# Patient Record
Sex: Female | Born: 2000
Health system: Southern US, Community
[De-identification: ages and names within clinical notes are randomized; demographics above are authoritative.]

## PROBLEM LIST (undated history)

## (undated) ENCOUNTER — Inpatient Hospital Stay (HOSPITAL_COMMUNITY): Payer: Self-pay

## (undated) DIAGNOSIS — M6740 Ganglion, unspecified site: Secondary | ICD-10-CM

## (undated) DIAGNOSIS — R011 Cardiac murmur, unspecified: Secondary | ICD-10-CM

## (undated) DIAGNOSIS — Z973 Presence of spectacles and contact lenses: Secondary | ICD-10-CM

## (undated) DIAGNOSIS — R569 Unspecified convulsions: Secondary | ICD-10-CM

## (undated) DIAGNOSIS — D649 Anemia, unspecified: Secondary | ICD-10-CM

## (undated) HISTORY — PX: NO PAST SURGERIES: SHX2092

## (undated) HISTORY — PX: WISDOM TOOTH EXTRACTION: SHX21

---
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
## 2022-02-17 ENCOUNTER — Other Ambulatory Visit (HOSPITAL_COMMUNITY): Payer: Self-pay

## 2022-02-17 ENCOUNTER — Ambulatory Visit (HOSPITAL_COMMUNITY)
Admission: EM | Admit: 2022-02-17 | Discharge: 2022-02-17 | Disposition: A | Discharge status: Home / Self Care | Payer: 59 | Attending: Emergency Medicine | Admitting: Emergency Medicine

## 2022-02-17 ENCOUNTER — Encounter (HOSPITAL_COMMUNITY): Payer: Self-pay | Admitting: Emergency Medicine

## 2022-02-17 ENCOUNTER — Other Ambulatory Visit: Payer: Self-pay

## 2022-02-17 DIAGNOSIS — J069 Acute upper respiratory infection, unspecified: Secondary | ICD-10-CM | POA: Diagnosis not present

## 2022-02-17 LAB — POC INFLUENZA A AND B ANTIGEN (URGENT CARE ONLY)
INFLUENZA A ANTIGEN, POC: NEGATIVE
INFLUENZA B ANTIGEN, POC: NEGATIVE

## 2022-02-17 MED ORDER — IBUPROFEN 800 MG PO TABS
800.0000 mg | ORAL_TABLET | Freq: Three times a day (TID) | ORAL | 0 refills | Status: DC
  Filled 2022-02-17: qty 21, 7d supply, fill #0

## 2022-02-17 MED ORDER — LIDOCAINE VISCOUS HCL 2 % MT SOLN
15.0000 mL | OROMUCOSAL | 0 refills | Status: DC | PRN
  Filled 2022-02-17: qty 100, 2d supply, fill #0

## 2022-02-17 NOTE — ED Provider Notes (Signed)
[place]    CSN: [id number] Arrival date & time: 02/17/22  1200      History   Chief Complaint Chief Complaint  Patient presents with   Cough    HPI Molly Bryant is a 22 y.o. female.   Patient presents for evaluation of fever, chills, body aches, nasal congestion, rhinorrhea, sore throat, bilateral ear pain, cough, nausea and diarrhea beginning 1 day ago.  Fever peaking at 101.3.  Last occurrence of diarrhea early this morning, stool is described as soft.  Cough is nonproductive, denies shortness of breath and wheezing.  No known sick contact prior.  Decreased appetite and poor fluid intake.  Has attempted use of Mucinex.  Past Medical History:  Diagnosis Date   Seizures [facility])     Patient Active Problem List   Diagnosis Date Noted   Encounter for routine child health examination without abnormal findings 09/20/2020    Past Surgical History:  Procedure Laterality Date   NO PAST SURGERIES      OB History   No obstetric history on file.      Home Medications    Prior to Admission medications   Medication Sig Start Date End Date Taking? Authorizing Provider  cyclobenzaprine (FLEXERIL) 5 MG tablet Take 1 tablet (5 mg total) by mouth 3 (three) times daily as needed for muscle spasms. Patient not taking: Reported on 02/17/2022 11/26/21   Drenda Freeze, MD  cyclobenzaprine (FLEXERIL) 5 MG tablet Take 1 tablet (5 mg total) by mouth 3 (three) times daily as needed for muscle spasms. Patient not taking: Reported on 02/17/2022 11/26/21   Drenda Freeze, MD  amitriptyline (ELAVIL) 50 MG tablet 25 mg at night If no improvement in symptoms after 3 weeks  than increase dose to 50 mg at night 10/08/19 11/13/19  Mir, Gwendolyn Lima, MD  diazepam (DIASAT) 20 MG GEL LOCK AT 12 5MG   TO BE GIVEN RECTALLY FOR SEIZURE LASTING GREATER THAN 5 MINUTES 11/22/16 11/13/19  [provider]  hyoscyamine (LEVSIN) 0.125 MG tablet Take 1 tablet (0.125 mg total) by mouth  every 4 (four) hours as needed. 09/08/19 11/13/19  Cletis Media, NP  midazolam (VERSED) 10 MG/2ML SOLN injection SPRAY 1 ML IN EACH NOSTRIL AS NEEDED FOR SEIZURES LASTING GREATER THAN 5 MINUTES 11/15/16 11/13/19  [provider]    Family History Family History  Problem Relation Age of Onset   Hypertension Mother    Migraines Mother    Bipolar disorder Paternal Aunt    Seizures Neg Hx    Depression Neg Hx    Anxiety disorder Neg Hx    Schizophrenia Neg Hx    ADD / ADHD Neg Hx    Autism Neg Hx     Social History Social History   Tobacco Use   Smoking status: Never   Smokeless tobacco: Never  Vaping Use   Vaping Use: Never used  Substance Use Topics   Alcohol use: No   Drug use: Not Currently    Types: Marijuana     Allergies   Penicillins   Review of Systems Review of Systems  Constitutional:  Positive for chills and fever. Negative for activity change, appetite change, diaphoresis, fatigue and unexpected weight change.  HENT:  Positive for congestion, ear pain, rhinorrhea and sore throat. Negative for dental problem, drooling, ear discharge, facial swelling, hearing loss, mouth sores, nosebleeds, postnasal drip, sinus pressure, sinus pain, sneezing, tinnitus, trouble swallowing and voice change.   Respiratory:  Positive for cough.  Negative for apnea, choking, chest tightness, shortness of breath, wheezing and stridor.   Cardiovascular: Negative.  Negative for chest pain.  Gastrointestinal:  Positive for diarrhea and nausea. Negative for abdominal distention, abdominal pain, anal bleeding, blood in stool, constipation, rectal pain and vomiting.  Musculoskeletal:  Positive for myalgias. Negative for arthralgias, back pain, gait problem, joint swelling, neck pain and neck stiffness.  Skin: Negative.      Physical Exam Triage Vital Signs ED Triage Vitals  Enc Vitals Group     BP 02/17/22 1313 120/78     Pulse Rate 02/17/22 1313 95     Resp 02/17/22 1313  16     Temp 02/17/22 1313 98.3 F (36.8 C)     Temp Source 02/17/22 1313 Oral     SpO2 02/17/22 1313 98 %     Weight --      Height --      Head Circumference --      Peak Flow --      Pain Score 02/17/22 1310 7     Pain Loc --      Pain Edu? --      Excl. in GC? --    No data found.  Updated Vital Signs BP 120/78   Pulse 95   Temp 98.3 F (36.8 C) (Oral)   Resp 16   LMP 02/04/2022   SpO2 98%   Visual Acuity Right Eye Distance:   Left Eye Distance:   Bilateral Distance:    Right Eye Near:   Left Eye Near:    Bilateral Near:     Physical Exam Constitutional:      Appearance: Normal appearance.  HENT:     Right Ear: Ear canal and external ear normal. A middle ear effusion is present.     Left Ear: Ear canal and external ear normal. A middle ear effusion is present.     Nose: Congestion and rhinorrhea present.     Mouth/Throat:     Mouth: Mucous membranes are moist.     Pharynx: Oropharynx is clear. Posterior oropharyngeal erythema present.  Eyes:     Extraocular Movements: Extraocular movements intact.  Cardiovascular:     Rate and Rhythm: Normal rate and regular rhythm.     Pulses: Normal pulses.     Heart sounds: Normal heart sounds.  Pulmonary:     Effort: Pulmonary effort is normal.     Breath sounds: Normal breath sounds.  Neurological:     Mental Status: She is alert and oriented to person, place, and time. Mental status is at baseline.      UC Treatments / Results  Labs (all labs ordered are listed, but only abnormal results are displayed) Labs Reviewed  POC INFLUENZA A AND B ANTIGEN (URGENT CARE ONLY)    EKG   Radiology No results found.  Procedures Procedures (including critical care time)  Medications Ordered in UC Medications - No data to display  Initial Impression / Assessment and Plan / UC Course  I have reviewed the triage vital signs and the nursing notes.  Pertinent labs & imaging results that were available during my  care of the patient were reviewed by me and considered in my medical decision making (see chart for details).  Viral URI with cough  Patient is in no signs of distress nor toxic appearing.  Vital signs are stable.  Low suspicion for pneumonia, pneumothorax or bronchitis and therefore will defer imaging.  Flu test negative. Prescribed viscous lidocaine and  ibuprofen 800 mg a sore throat is most worrisome symptom. May use additional over-the-counter medications as needed for supportive care.  May follow-up with urgent care as needed if symptoms persist or worsen.  Note given.   Final Clinical Impressions(s) / UC Diagnoses   Final diagnoses:  None   Discharge Instructions   None    ED Prescriptions   None    PDMP not reviewed this encounter.   Hans Eden, NP 02/17/22 1406

## 2022-02-17 NOTE — Discharge Instructions (Signed)
Your symptoms today are most likely being caused by a virus and should steadily improve in time it can take up to 7 to 10 days before you truly start to see a turnaround however things will get better  Flu test negative  You may gargle and spit lidocaine solution every 4 hours to provide a temporary numbing effect to your throat  You may use ibuprofen's every 8 hours in addition to Tylenol 500 mg every 6 hours for management of pain    You can take Tylenol and/or Ibuprofen as needed for fever reduction and pain relief.   For cough: honey 1/2 to 1 teaspoon (you can dilute the honey in water or another fluid).  You can also use guaifenesin and dextromethorphan for cough. You can use a humidifier for chest congestion and cough.  If you don't have a humidifier, you can sit in the bathroom with the hot shower running.      For sore throat: try warm salt water gargles, cepacol lozenges, throat spray, warm tea or water with lemon/honey, popsicles or ice, or OTC cold relief medicine for throat discomfort.   For congestion: take a daily anti-histamine like Zyrtec, Claritin, and a oral decongestant, such as pseudoephedrine.  You can also use Flonase 1-2 sprays in each nostril daily.   It is important to stay hydrated: drink plenty of fluids (water, gatorade/powerade/pedialyte, juices, or teas) to keep your throat moisturized and help further relieve irritation/discomfort.

## 2022-02-17 NOTE — ED Triage Notes (Signed)
Complains of cough congestion and body aches, sore throat and fever (101+ last night )  Has not taken any medicines today.   Symptoms started yesterday morning

## 2022-02-24 ENCOUNTER — Ambulatory Visit (HOSPITAL_COMMUNITY)
Admission: EM | Admit: 2022-02-24 | Discharge: 2022-02-24 | Disposition: A | Discharge status: Home / Self Care | Payer: 59 | Attending: Emergency Medicine | Admitting: Emergency Medicine

## 2022-02-24 ENCOUNTER — Other Ambulatory Visit (HOSPITAL_COMMUNITY): Payer: Self-pay

## 2022-02-24 ENCOUNTER — Telehealth: Payer: Self-pay | Admitting: Pediatrics

## 2022-02-24 ENCOUNTER — Encounter (HOSPITAL_COMMUNITY): Payer: Self-pay

## 2022-02-24 DIAGNOSIS — L42 Pityriasis rosea: Secondary | ICD-10-CM

## 2022-02-24 DIAGNOSIS — J069 Acute upper respiratory infection, unspecified: Secondary | ICD-10-CM | POA: Diagnosis not present

## 2022-02-24 MED ORDER — DM-GUAIFENESIN ER 30-600 MG PO TB12
1.0000 | ORAL_TABLET | Freq: Two times a day (BID) | ORAL | 0 refills | Status: AC
  Filled 2022-02-24: qty 10, 5d supply, fill #0

## 2022-02-24 NOTE — Discharge Instructions (Addendum)
Cough and congestion: You likely have a virus. This is treated with symptomatic care. I recommend mucinex DM twice daily - this medicine works best if used with lots of fluids. You can also try once daily allegra.  Rash: Pityriasis rosea is a rash that often begins as an oval spot on the face, chest, abdomen or back. This is called a herald patch and may be up to 4 inches across. Then you may get smaller spots that sweep out from the middle of the body. The rash can be itchy. Pityriasis rosea can happen at any age but is most common between the ages of 15 and 87. It tends to go away on its own within 10 weeks. The rash persists for several weeks and heals without scarring. Medicated lotions may lessen itchiness and speed the disappearance of the rash. Often, though, no treatment is required. The condition is not contagious and seldom recurs  If you are concerned you can follow with your primary care provider, or see a dermatologist.

## 2022-02-24 NOTE — ED Triage Notes (Signed)
Chief Complaint: Patient was seen a week ago for URI symptoms. States cough and congestion worse. States now has a rash. States the rash is spreading from the shoulders and throughout to torso.   Onset: rash started last Wednesday   Prescriptions or OTC medications tried: Yes- lidocaine oral, ibuprofen.     with mild relief  Sick exposure: No  New foods, medications, or products: No  Recent Travel: No

## 2022-02-24 NOTE — ED Provider Notes (Signed)
[place]    CSN: [id number] Arrival date & time: 02/24/22  1006      History   Chief Complaint Chief Complaint  Patient presents with   Rash   Cough    HPI Molly Bryant is a 22 y.o. female.  Here with rash x 1 week Not itchy, not painful Started behind her ear. Now some spots on the face, right upper arm, low back, one on the belly She tried hydrocortisone cream Denies known exposures to allergens. No new medications or products. Uses sensitive skin soaps  Seen last week for URI Reports continued cough and congestion Has not used any medications apart from ibuprofen No fevers  Past Medical History:  Diagnosis Date   Seizures [facility])     Patient Active Problem List   Diagnosis Date Noted   Encounter for routine child health examination without abnormal findings 09/20/2020    Past Surgical History:  Procedure Laterality Date   NO PAST SURGERIES      OB History   No obstetric history on file.      Home Medications    Prior to Admission medications   Medication Sig Start Date End Date Taking? Authorizing Provider  dextromethorphan-guaiFENesin (MUCINEX DM) 30-600 MG 12hr tablet Take 1 tablet by mouth 2 (two) times daily for 5 days. 02/24/22 03/01/22 Yes Tashawnda Bleiler, Wells Guiles, PA-C  amitriptyline (ELAVIL) 50 MG tablet 25 mg at night If no improvement in symptoms after 3 weeks  than increase dose to 50 mg at night 10/08/19 11/13/19  Mir, Gwendolyn Lima, MD  diazepam (DIASAT) 20 MG GEL LOCK AT 12 5MG   TO BE GIVEN RECTALLY FOR SEIZURE LASTING GREATER THAN 5 MINUTES 11/22/16 11/13/19  [provider]  hyoscyamine (LEVSIN) 0.125 MG tablet Take 1 tablet (0.125 mg total) by mouth every 4 (four) hours as needed. 09/08/19 11/13/19  Cletis Media, NP  midazolam (VERSED) 10 MG/2ML SOLN injection SPRAY 1 ML IN EACH NOSTRIL AS NEEDED FOR SEIZURES LASTING GREATER THAN 5 MINUTES 11/15/16 11/13/19  [provider]    Family History Family History  Problem  Relation Age of Onset   Hypertension Mother    Migraines Mother    Bipolar disorder Paternal Aunt    Seizures Neg Hx    Depression Neg Hx    Anxiety disorder Neg Hx    Schizophrenia Neg Hx    ADD / ADHD Neg Hx    Autism Neg Hx     Social History Social History   Tobacco Use   Smoking status: Never   Smokeless tobacco: Never  Vaping Use   Vaping Use: Never used  Substance Use Topics   Alcohol use: No   Drug use: Not Currently    Types: Marijuana     Allergies   Penicillins   Review of Systems Review of Systems As per HPI  Physical Exam Triage Vital Signs ED Triage Vitals  Enc Vitals Group     BP 02/24/22 1123 120/74     Pulse Rate 02/24/22 1123 95     Resp 02/24/22 1123 16     Temp 02/24/22 1123 98.1 F (36.7 C)     Temp Source 02/24/22 1123 Oral     SpO2 02/24/22 1123 98 %     Weight 02/24/22 1122 128 lb 12 oz (58.4 kg)     Height 02/24/22 1122 5' (1.524 m)     Head Circumference --      Peak Flow --      Pain  Score 02/24/22 1121 5     Pain Loc --      Pain Edu? --      Excl. in GC? --    No data found.  Updated Vital Signs BP 120/74 (BP Location: Left Arm)   Pulse 95   Temp 98.1 F (36.7 C) (Oral)   Resp 16   Ht 5' (1.524 m)   Wt 128 lb 12 oz (58.4 kg)   LMP 02/04/2022   SpO2 98%   BMI 25.14 kg/m     Physical Exam Vitals and nursing note reviewed.  Constitutional:      General: She is not in acute distress.    Appearance: Normal appearance.  HENT:     Right Ear: Tympanic membrane and ear canal normal.     Left Ear: Tympanic membrane and ear canal normal.     Nose: Congestion present. No rhinorrhea.     Mouth/Throat:     Mouth: Mucous membranes are moist.     Pharynx: Oropharynx is clear. No posterior oropharyngeal erythema.  Eyes:     Conjunctiva/sclera: Conjunctivae normal.  Cardiovascular:     Rate and Rhythm: Normal rate and regular rhythm.     Pulses: Normal pulses.     Heart sounds: Normal heart sounds.  Pulmonary:      Effort: Pulmonary effort is normal.     Breath sounds: Normal breath sounds.  Abdominal:     Tenderness: There is no abdominal tenderness.  Musculoskeletal:     Cervical back: Normal range of motion.  Lymphadenopathy:     Cervical: No cervical adenopathy.  Skin:    Findings: Rash present.     Comments: A couple red macular, round areas with some central dry crusting to the face, right shoulder, low back, low belly. No central clearing or raised border.  Neurological:     Mental Status: She is alert.     UC Treatments / Results  Labs (all labs ordered are listed, but only abnormal results are displayed) Labs Reviewed - No data to display  EKG   Radiology No results found.  Procedures Procedures   Medications Ordered in UC Medications - No data to display  Initial Impression / Assessment and Plan / UC Course  I have reviewed the triage vital signs and the nursing notes.  Pertinent labs & imaging results that were available during my care of the patient were reviewed by me and considered in my medical decision making (see chart for details).  Viral URI Discussed symptomatic care for congestion and cough. Recommend mucinex DM with lots of fluids. Can add daily allergy med as well.  Rash Seems to be pityriasis Discussed etiology. Discussed no treatment given she has no pain or itching. Provided further information in AVS Can follow with derm if symptoms persist or worsen. Patient understands rash can remain for weeks to months.  Final Clinical Impressions(s) / UC Diagnoses   Final diagnoses:  Pityriasis rosea  Viral URI with cough     Discharge Instructions      Cough and congestion: You likely have a virus. This is treated with symptomatic care. I recommend mucinex DM twice daily - this medicine works best if used with lots of fluids. You can also try once daily allegra.  Rash: Pityriasis rosea is a rash that often begins as an oval spot on the face, chest,  abdomen or back. This is called a herald patch and may be up to 4 inches across. Then you may get  smaller spots that sweep out from the middle of the body. The rash can be itchy. Pityriasis rosea can happen at any age but is most common between the ages of 90 and 70. It tends to go away on its own within 10 weeks. The rash persists for several weeks and heals without scarring. Medicated lotions may lessen itchiness and speed the disappearance of the rash. Often, though, no treatment is required. The condition is not contagious and seldom recurs  If you are concerned you can follow with your primary care provider, or see a dermatologist.     ED Prescriptions     Medication Sig Dispense Auth. Provider   dextromethorphan-guaiFENesin (MUCINEX DM) 30-600 MG 12hr tablet Take 1 tablet by mouth 2 (two) times daily for 5 days. 10 tablet Romi Rathel, Wells Guiles, PA-C      PDMP not reviewed this encounter.   [place], [place] 02/24/22 1335

## 2022-02-24 NOTE — Telephone Encounter (Signed)
Patient has been scheduled

## 2022-02-24 NOTE — Telephone Encounter (Signed)
Molly Bryant called in and was wanting to know if Molly Bryant would take her on a new patient. Her two older sisters [name] and [name] are current patients of hers. Please advise. Thank you!

## 2022-02-24 NOTE — Telephone Encounter (Signed)
Yes, of course

## 2022-02-26 ENCOUNTER — Ambulatory Visit (INDEPENDENT_AMBULATORY_CARE_PROVIDER_SITE_OTHER): Payer: 59 | Admitting: Primary Care

## 2022-02-26 ENCOUNTER — Other Ambulatory Visit (HOSPITAL_COMMUNITY): Payer: Self-pay

## 2022-02-26 ENCOUNTER — Encounter: Payer: Self-pay | Admitting: Primary Care

## 2022-02-26 VITALS — BP 124/78 | HR 88 | Temp 97.9°F | Ht 60.0 in | Wt 134.0 lb

## 2022-02-26 DIAGNOSIS — L42 Pityriasis rosea: Secondary | ICD-10-CM | POA: Insufficient documentation

## 2022-02-26 DIAGNOSIS — Z87898 Personal history of other specified conditions: Secondary | ICD-10-CM | POA: Insufficient documentation

## 2022-02-26 DIAGNOSIS — Z30011 Encounter for initial prescription of contraceptive pills: Secondary | ICD-10-CM | POA: Insufficient documentation

## 2022-02-26 DIAGNOSIS — L700 Acne vulgaris: Secondary | ICD-10-CM | POA: Insufficient documentation

## 2022-02-26 LAB — POCT URINE PREGNANCY: Preg Test, Ur: NEGATIVE

## 2022-02-26 MED ORDER — ACYCLOVIR 400 MG PO TABS
400.0000 mg | ORAL_TABLET | Freq: Every day | ORAL | 0 refills | Status: AC
  Filled 2022-02-26: qty 35, 7d supply, fill #0

## 2022-02-26 MED ORDER — NORETHIN ACE-ETH ESTRAD-FE 1-20 MG-MCG PO TABS
1.0000 | ORAL_TABLET | Freq: Every day | ORAL | 3 refills | Status: DC
  Filled 2022-02-26: qty 84, 84d supply, fill #0
  Filled 2022-05-31: qty 84, 84d supply, fill #1

## 2022-02-26 NOTE — Assessment & Plan Note (Signed)
Classic case on exam today.  Likely provoked by prior viral URI symptoms.   Discussed that treatment options are limited but that we could try acyclovir, especially given the rapid widespread case. She agrees.  Start acyclovir 400 mg five times daily x 7 days. She will update.

## 2022-02-26 NOTE — Patient Instructions (Signed)
Start acyclovir 400 mg tablets for the rash. Take 1 tablet by mouth five times daily for 1 week.  Take your first birth control pill on the Sunday after your period starts. For example, if you start your period on Monday, then take the birth control pill that following Sunday. If you start your period on Sunday, then take your first pill that same day.  You must take your pill at the same time each day.  If you miss a pill then take the pill as soon as you remember, and also take your pill at the regularly scheduled time, even if this means taking two pills in one day. If you miss more than two pills consecutively, then please call me.  It may take three to six months for birth control pills to regulate your cycle and improve symptoms.  It was a pleasure to meet you today! Please don't hesitate to contact me with any questions. Welcome to [facility]!

## 2022-02-26 NOTE — Assessment & Plan Note (Signed)
Chronic, evident on exam today. Urine pregnancy test negative today.  Start June 1/20 mcg-mg tablets. Discussed instructions for use, when to start, back up birth control for 1-2 weeks.

## 2022-02-26 NOTE — Assessment & Plan Note (Signed)
No seizure in years. Reviewed hospital stay from [place] in 2018.  Remain off treatment.

## 2022-02-26 NOTE — Progress Notes (Signed)
Subjective:    Patient ID: Molly Bryant, female    DOB: May 31, 2000, 22 y.o.   MRN: [id number]  HPI  Molly Bryant is a very pleasant 22 y.o. female who presents today who presents today to establish care and discuss the problems mentioned below. Will obtain/review records.  1) Rash: Evaluated at urgent care on 02/17/22 for URI symptoms, was prescribed viscous lidocaine and Ibuprofen for viral etiology.  Evaluated on 02/24/22 for a one week history of non itchy, non painful rash behind her ears, face, upper extremities, lower back, and abdomen. She was told that she had pityriasis rosea and that there was nothing that could be done.  She's tried hydrocortisone cream and Benadryl cream OTC without improvement.   She does have multiple environmental allergies but she denies hives.   No new lotions, detergents, soaps or shampoos. No new medicines, vitamins, supplements. No new pets. No recent outdoor exposure or poison ivy exposure. No bonfire or smoke exposure.  No recent motel or hotel stay or new beds.   No fevers/chills, oral lesions, new joint pains, tick bites, abdominal pain, nausea.   Her respiratory viral symptoms have almost resolved completely.   2) Contraception: She would like to start birth control for acne control and pregnancy prevention. She was managed on OCP's, took for a few years, which helped tremendously with ance, but had to stop as she could not afford follow up office visits for refills.   Upon discontinuation of OCP's her acne has been uncontrolled. Her last dose of OCP's was in July 2023.  Menses typically occurs once monthly, last around 4-5 days, no dysmenorrhea or menorrhagia. LMP was December 30-Jan 4th. She is now sexually active.   3) Seizures: Diagnosed at the age of 49, admitted to [facility] in 2018, underwent extensive testing, EEG was negative.  Followed with neurology at the time, was never on daily preventative. She was  prescribed Her last seizure was around the age of 26 or 25. She has since been released by neurology. No seizure activity since the age of 29 or 64.   Review of Systems  HENT:  Positive for congestion and postnasal drip.   Respiratory:  Negative for cough.   Genitourinary:  Negative for menstrual problem.  Skin:  Positive for rash.  Allergic/Immunologic: Positive for environmental allergies.  Neurological:  Negative for seizures.  Hematological:  Negative for adenopathy.         Past Medical History:  Diagnosis Date   Seizures (HCC)     Social History   Socioeconomic History   Marital status: Single    Spouse name: Not on file   Number of children: Not on file   Years of education: Not on file   Highest education level: Not on file  Occupational History   Not on file  Tobacco Use   Smoking status: Never   Smokeless tobacco: Never  Vaping Use   Vaping Use: Never used  Substance and Sexual Activity   Alcohol use: No   Drug use: Not Currently    Types: Marijuana   Sexual activity: Not Currently    Birth control/protection: None  Other Topics Concern   Not on file  Social History Narrative   Molly Bryant is in [employer] going for her associates.     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     Wants to become a Transport planner.     Lives with mother, brother, and 3 sisters.  Parents are divorced.   Social Determinants of Health   Financial Resource Strain: Not on file  Food Insecurity: Not on file  Transportation Needs: Not on file  Physical Activity: Not on file  Stress: Not on file  Social Connections: Not on file  Intimate Partner Violence: Not on file    Past Surgical History:  Procedure Laterality Date   NO PAST SURGERIES      Family History  Problem Relation Age of Onset   Hypertension Mother    Migraines Mother    Bipolar disorder Paternal Aunt    Seizures Neg Hx    Depression Neg Hx    Anxiety disorder Neg Hx    Schizophrenia  Neg Hx    ADD / ADHD Neg Hx    Autism Neg Hx     Allergies  Allergen Reactions   Penicillins Rash    Current Outpatient Medications on File Prior to Visit  Medication Sig Dispense Refill   dextromethorphan-guaiFENesin (MUCINEX DM) 30-600 MG 12hr tablet Take 1 tablet by mouth 2 (two) times daily for 5 days. 10 tablet 0   [DISCONTINUED] amitriptyline (ELAVIL) 50 MG tablet 25 mg at night If no improvement in symptoms after 3 weeks  than increase dose to 50 mg at night 30 tablet 1   [DISCONTINUED] diazepam (DIASAT) 20 MG GEL LOCK AT 12 5MG   TO BE GIVEN RECTALLY FOR SEIZURE LASTING GREATER THAN 5 MINUTES  2   [DISCONTINUED] hyoscyamine (LEVSIN) 0.125 MG tablet Take 1 tablet (0.125 mg total) by mouth every 4 (four) hours as needed. 60 tablet 1   [DISCONTINUED] midazolam (VERSED) 10 MG/2ML SOLN injection SPRAY 1 ML IN EACH NOSTRIL AS NEEDED FOR SEIZURES LASTING GREATER THAN 5 MINUTES  1   No current facility-administered medications on file prior to visit.    BP 124/78   Pulse 88   Temp 97.9 F (36.6 C) (Temporal)   Ht 5' (1.524 m)   Wt 134 lb (60.8 kg)   LMP 02/06/2022 (Exact Date)   SpO2 98%   BMI 26.17 kg/m  Objective:   Physical Exam Cardiovascular:     Rate and Rhythm: Normal rate and regular rhythm.  Pulmonary:     Effort: Pulmonary effort is normal.     Breath sounds: Normal breath sounds.  Musculoskeletal:     Cervical back: Neck supple.  Skin:    General: Skin is warm and dry.     Comments: Widespread oval lesions with mild erythema located to bilateral shoulders, posterior trunk, abdomen, behind ears, and to bilateral jaw.   Acne vulgaris noted to forehead, cheeks           Assessment & Plan:  Pityriasis rosea Assessment & Plan: Classic case on exam today.  Likely provoked by prior viral URI symptoms.   Discussed that treatment options are limited but that we could try acyclovir, especially given the rapid widespread case. She agrees.  Start acyclovir  400 mg five times daily x 7 days. She will update.   Orders: -     Acyclovir; Take 1 tablet (400 mg total) by mouth 5 (five) times daily for 7 days.  Dispense: 35 tablet; Refill: 0  Encounter for initial prescription of contraceptive pills -     POCT urine pregnancy -     Norethin Ace-Eth Estrad-FE; Take 1 tablet by mouth daily.  Dispense: 84 tablet; Refill: 3  Acne vulgaris Assessment & Plan: Chronic, evident on exam today. Urine pregnancy test negative today.  Start June  1/20 mcg-mg tablets. Discussed instructions for use, when to start, back up birth control for 1-2 weeks.   History of seizure Assessment & Plan: No seizure in years. Reviewed hospital stay from [facility] in 2018.  Remain off treatment.         Pleas Koch, NP

## 2022-04-01 ENCOUNTER — Telehealth: Payer: Self-pay | Admitting: Primary Care

## 2022-04-01 NOTE — Telephone Encounter (Signed)
Patient is requesting a call back about birth control.Didn't want to disclose any other info. Call back is [phone]

## 2022-04-01 NOTE — Telephone Encounter (Signed)
Called patient back. States she started on 03/14/22. She had intercourse last night and condom broke. She wanted to know if she would be ok. She has not missed any doses of OCP and has taken same time every day. Last cycle was 03/12/22.

## 2022-04-01 NOTE — Telephone Encounter (Signed)
This should be okay as long as she has been consistent with her OCPs. Recommend she check a urine pregnancy test if she is late on her menstrual cycle for March.

## 2022-04-02 NOTE — Telephone Encounter (Signed)
Advised patient of Clearence Cheek message. Advised to call back with any further questions.

## 2022-04-02 NOTE — Telephone Encounter (Signed)
See phone note regarding this for further documentation.

## 2022-04-29 ENCOUNTER — Ambulatory Visit (INDEPENDENT_AMBULATORY_CARE_PROVIDER_SITE_OTHER): Payer: 59 | Admitting: Primary Care

## 2022-04-29 ENCOUNTER — Encounter: Payer: Self-pay | Admitting: Primary Care

## 2022-04-29 VITALS — BP 126/74 | HR 80 | Temp 98.2°F | Ht 60.0 in | Wt 138.0 lb

## 2022-04-29 DIAGNOSIS — N926 Irregular menstruation, unspecified: Secondary | ICD-10-CM

## 2022-04-29 LAB — POCT URINE PREGNANCY: Preg Test, Ur: NEGATIVE

## 2022-04-29 NOTE — Assessment & Plan Note (Signed)
Likely secondary to initiation of OCP's, discussed this with patient today. Urine pregnancy test today negative.   Will consult with GYN regarding options. Consider changing hormone level of OCP vs continuing current treatment for now.  Patient will update as well.

## 2022-04-29 NOTE — Progress Notes (Signed)
Subjective:    Patient ID: Molly Bryant, female    DOB: 24-Apr-2000, 22 y.o.   MRN: [id number]  HPI  Molly Bryant is a very pleasant 22 y.o. female with a history of pityriasis rosea, acne vulgaris, seizures who presents today to discuss abnormal uterine bleeding.  She was last evaluated by me on 02/26/22 to establish care. During this visit she was initiated on OCP's for acne vulgaris which worked well previously. Urine pregnancy test was negative so Rx for Junel 1/20 mcg/mg was sent to pharmacy.  She's here today to discuss abnormal bleeding. During her last visit LMP was December 30th 2023-Jan 4th 2024. She began her OCP's on Feb 4th, menstrual cycle began on Feb 2nd and ended on Feb 6th.   She did experience menstrual bleeding February 26-29, normal period, stopped bleeding on the 29th. Then bleeding resumed March 9 through today. Her recent vaginal bleeding has been more like spotting through yesterday, but today she experienced bright red, heavy bleeding without clots.   She's taken 2 pregnancy tests over the last week, both of which were negative. She has not missed any of her OCP's   Review of Systems  Constitutional:  Positive for fatigue.  Genitourinary:  Positive for menstrual problem.       Pelvic cramping  Musculoskeletal:  Positive for back pain.         Past Medical History:  Diagnosis Date   Seizures ([place])     Social History   Socioeconomic History   Marital status: Single    Spouse name: Not on file   Number of children: Not on file   Years of education: Not on file   Highest education level: Not on file  Occupational History   Not on file  Tobacco Use   Smoking status: Never   Smokeless tobacco: Never  Vaping Use   Vaping Use: Never used  Substance and Sexual Activity   Alcohol use: No   Drug use: Not Currently    Types: Marijuana   Sexual activity: Not Currently    Birth control/protection: None  Other Topics Concern   Not on file   Social History Narrative   [name] is in [employer] going for her associates.     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     Wants to become a Energy manager.     Lives with mother, brother, and 3 sisters.   Parents are divorced.   Social Determinants of Health   Financial Resource Strain: Not on file  Food Insecurity: Not on file  Transportation Needs: Not on file  Physical Activity: Not on file  Stress: Not on file  Social Connections: Not on file  Intimate Partner Violence: Not on file    Past Surgical History:  Procedure Laterality Date   NO PAST SURGERIES      Family History  Problem Relation Age of Onset   Hypertension Mother    Migraines Mother    Bipolar disorder Paternal Aunt    Seizures Neg Hx    Depression Neg Hx    Anxiety disorder Neg Hx    Schizophrenia Neg Hx    ADD / ADHD Neg Hx    Autism Neg Hx     Allergies  Allergen Reactions   Penicillins Rash    Current Outpatient Medications on File Prior to Visit  Medication Sig Dispense Refill   norethindrone-ethinyl estradiol-FE (JUNEL FE 1/20) 1-20 MG-MCG tablet Take 1 tablet by mouth  daily. 84 tablet 3   [DISCONTINUED] amitriptyline (ELAVIL) 50 MG tablet 25 mg at night If no improvement in symptoms after 3 weeks  than increase dose to 50 mg at night 30 tablet 1   [DISCONTINUED] diazepam (DIASAT) 20 MG GEL LOCK AT 12 5MG   TO BE GIVEN RECTALLY FOR SEIZURE LASTING GREATER THAN 5 MINUTES  2   [DISCONTINUED] hyoscyamine (LEVSIN) 0.125 MG tablet Take 1 tablet (0.125 mg total) by mouth every 4 (four) hours as needed. 60 tablet 1   [DISCONTINUED] midazolam (VERSED) 10 MG/2ML SOLN injection SPRAY 1 ML IN EACH NOSTRIL AS NEEDED FOR SEIZURES LASTING GREATER THAN 5 MINUTES  1   No current facility-administered medications on file prior to visit.    BP 126/74   Pulse 80   Temp 98.2 F (36.8 C) (Temporal)   Ht 5' (1.524 m)   Wt 138 lb (62.6 kg)   LMP 04/17/2022 (Exact Date)   SpO2 98%    BMI 26.95 kg/m  Objective:   Physical Exam Cardiovascular:     Rate and Rhythm: Normal rate and regular rhythm.  Pulmonary:     Effort: Pulmonary effort is normal.     Breath sounds: Normal breath sounds.  Musculoskeletal:     Cervical back: Neck supple.  Skin:    General: Skin is warm and dry.           Assessment & Plan:  Abnormal menstrual periods Assessment & Plan: Likely secondary to initiation of OCP's, discussed this with patient today. Urine pregnancy test today negative.   Will consult with GYN regarding options. Consider changing hormone level of OCP vs continuing current treatment for now.  Patient will update as well.   Orders: -     POCT urine pregnancy        Pleas Koch, NP

## 2022-04-29 NOTE — Patient Instructions (Addendum)
I will be in touch as soon as I speak with gynecology.   Continue taking the birth control pills for now.  It was a pleasure to see you today!

## 2022-06-01 ENCOUNTER — Other Ambulatory Visit (HOSPITAL_COMMUNITY): Payer: Self-pay

## 2022-06-04 ENCOUNTER — Other Ambulatory Visit (HOSPITAL_COMMUNITY): Payer: Self-pay

## 2022-06-07 ENCOUNTER — Other Ambulatory Visit (HOSPITAL_COMMUNITY): Payer: Self-pay

## 2022-06-19 ENCOUNTER — Ambulatory Visit (INDEPENDENT_AMBULATORY_CARE_PROVIDER_SITE_OTHER): Payer: 59

## 2022-06-19 ENCOUNTER — Encounter (HOSPITAL_COMMUNITY): Payer: Self-pay

## 2022-06-19 ENCOUNTER — Ambulatory Visit (HOSPITAL_COMMUNITY)
Admission: EM | Admit: 2022-06-19 | Discharge: 2022-06-19 | Disposition: A | Discharge status: Home / Self Care | Payer: 59 | Attending: Emergency Medicine | Admitting: Emergency Medicine

## 2022-06-19 DIAGNOSIS — M7989 Other specified soft tissue disorders: Secondary | ICD-10-CM | POA: Diagnosis not present

## 2022-06-19 DIAGNOSIS — M25531 Pain in right wrist: Secondary | ICD-10-CM

## 2022-06-19 MED ORDER — IBUPROFEN 800 MG PO TABS
ORAL_TABLET | ORAL | Status: AC
  Filled 2022-06-19: qty 1

## 2022-06-19 MED ORDER — IBUPROFEN 600 MG PO TABS: 600.0000 mg | ORAL_TABLET | Freq: Four times a day (QID) | ORAL | 0 refills | Status: AC | PRN

## 2022-06-19 MED ORDER — IBUPROFEN 600 MG PO TABS
600.0000 mg | ORAL_TABLET | Freq: Once | ORAL | Status: DC
Start: 2022-06-19 — End: 2022-06-19

## 2022-06-19 MED ORDER — IBUPROFEN 800 MG PO TABS
800.0000 mg | ORAL_TABLET | Freq: Once | ORAL | Status: AC
  Administered 2022-06-19: 800 mg via ORAL

## 2022-06-19 NOTE — ED Provider Notes (Signed)
[facility]    CSN: [id number] Arrival date & time: 06/19/22  1103      History   Chief Complaint Chief Complaint  Patient presents with   Wrist Pain    HPI Molly Bryant is a 22 y.o. female.   Patient presents to clinic for right wrist pain that has been present for the past 4 days with associated swelling.  She reports she has a knot at the top of her wrist that has been there for a while, however, she has just noticed that is increasing, painful.  She denies any falls, trauma, injuries or twisting to the wrist.  Has not taken any medication for pain today.  The history is provided by the patient and medical records.  Wrist Pain    Past Medical History:  Diagnosis Date   Seizures [facility])     Patient Active Problem List   Diagnosis Date Noted   Abnormal menstrual periods 04/29/2022   Encounter for initial prescription of contraceptive pills 02/26/2022   Pityriasis rosea 02/26/2022   Acne vulgaris 02/26/2022   History of seizure 02/26/2022    Past Surgical History:  Procedure Laterality Date   NO PAST SURGERIES      OB History   No obstetric history on file.      Home Medications    Prior to Admission medications   Medication Sig Start Date End Date Taking? Authorizing Provider  ibuprofen (ADVIL) 600 MG tablet Take 1 tablet (600 mg total) by mouth every 6 (six) hours as needed for up to 7 days. 06/19/22 06/26/22 Yes Rinaldo Ratel, [place] N, FNP  norethindrone-ethinyl estradiol-FE (JUNEL FE 1/20) 1-20 MG-MCG tablet Take 1 tablet by mouth daily. 02/26/22  Yes Doreene Nest, NP  amitriptyline (ELAVIL) 50 MG tablet 25 mg at night If no improvement in symptoms after 3 weeks  than increase dose to 50 mg at night 10/08/19 11/13/19  Mir, Shirlyn Goltz, MD  diazepam (DIASAT) 20 MG GEL LOCK AT 12 5MG   TO BE GIVEN RECTALLY FOR SEIZURE LASTING GREATER THAN 5 MINUTES 11/22/16 11/13/19  [provider]  hyoscyamine (LEVSIN) 0.125 MG tablet Take 1 tablet (0.125  mg total) by mouth every 4 (four) hours as needed. 09/08/19 11/13/19  Fredia Sorrow, NP  midazolam (VERSED) 10 MG/2ML SOLN injection SPRAY 1 ML IN EACH NOSTRIL AS NEEDED FOR SEIZURES LASTING GREATER THAN 5 MINUTES 11/15/16 11/13/19  [provider]    Family History Family History  Problem Relation Age of Onset   Hypertension Mother    Migraines Mother    Bipolar disorder Paternal Aunt    Seizures Neg Hx    Depression Neg Hx    Anxiety disorder Neg Hx    Schizophrenia Neg Hx    ADD / ADHD Neg Hx    Autism Neg Hx     Social History Social History   Tobacco Use   Smoking status: Never   Smokeless tobacco: Never  Vaping Use   Vaping Use: Never used  Substance Use Topics   Alcohol use: No   Drug use: Not Currently    Types: Marijuana     Allergies   Penicillins   Review of Systems Review of Systems  Musculoskeletal:  Positive for joint swelling.     Physical Exam Triage Vital Signs ED Triage Vitals  Enc Vitals Group     BP 06/19/22 1201 123/80     Pulse Rate 06/19/22 1201 84     Resp 06/19/22 1201 18  Temp 06/19/22 1201 98.4 F (36.9 C)     Temp Source 06/19/22 1201 Oral     SpO2 06/19/22 1201 97 %     Weight --      Height --      Head Circumference --      Peak Flow --      Pain Score 06/19/22 1200 6     Pain Loc --      Pain Edu? --      Excl. in GC? --    No data found.  Updated Vital Signs BP 123/80 (BP Location: Left Arm)   Pulse 84   Temp 98.4 F (36.9 C) (Oral)   Resp 18   LMP 05/28/2022 (Approximate)   SpO2 97%   Visual Acuity Right Eye Distance:   Left Eye Distance:   Bilateral Distance:    Right Eye Near:   Left Eye Near:    Bilateral Near:     Physical Exam Vitals and nursing note reviewed.  Constitutional:      Appearance: Normal appearance.  HENT:     Head: Normocephalic and atraumatic.     Right Ear: External ear normal.     Left Ear: External ear normal.     Nose: Nose normal.     Mouth/Throat:      Pharynx: Oropharynx is clear.  Eyes:     Conjunctiva/sclera: Conjunctivae normal.  Cardiovascular:     Rate and Rhythm: Normal rate.  Pulmonary:     Effort: Pulmonary effort is normal. No respiratory distress.  Musculoskeletal:        General: Swelling and tenderness present. No deformity or signs of injury. Normal range of motion.     Right wrist: Swelling, tenderness and bony tenderness present. No snuff box tenderness or crepitus. Normal range of motion. Normal pulse.  Skin:    General: Skin is warm and dry.     Capillary Refill: Capillary refill takes less than 2 seconds.  Neurological:     General: No focal deficit present.     Mental Status: She is alert and oriented to person, place, and time.  Psychiatric:        Mood and Affect: Mood normal.        Behavior: Behavior normal. Behavior is cooperative.      UC Treatments / Results  Labs (all labs ordered are listed, but only abnormal results are displayed) Labs Reviewed - No data to display  EKG   Radiology DG Wrist Complete Right  Result Date: 06/19/2022 CLINICAL DATA:  Pain and swelling. EXAM: RIGHT WRIST - COMPLETE 3+ VIEW COMPARISON:  None Available. FINDINGS: There is no evidence of fracture or dislocation. There is no evidence of arthropathy or other focal bone abnormality. Soft tissues are unremarkable. IMPRESSION: Negative. Electronically Signed   By: Emmaline Kluver M.D.   On: 06/19/2022 12:22    Procedures Procedures (including critical care time)  Medications Ordered in UC Medications  ibuprofen (ADVIL) tablet 800 mg (800 mg Oral Given 06/19/22 1226)    Initial Impression / Assessment and Plan / UC Course  I have reviewed the triage vital signs and the nursing notes.  Pertinent labs & imaging results that were available during my care of the patient were reviewed by me and considered in my medical decision making (see chart for details).  Vitals and triage reviewed, patient is hemodynamically stable.   Right wrist pain and swelling for the past 4 days without known injury, imaging negative for fracture or  dislocation.  Patient is neurovascularly intact with brisk capillary refill and sensation.  Pain with flexion and extension of wrist and swelling over carpal bones. Given wrist brace in clinic as well as anti-inflammatories.  Ortho follow-up as needed.  Plan of care, follow-up and return precautions reviewed, no questions at this time.    Final Clinical Impressions(s) / UC Diagnoses   Final diagnoses:  Right wrist pain     Discharge Instructions      Your x-rays were negative for acute fracture or dislocation.  Please wear the wrist brace throughout the day and at night to help with pain and swelling.  You can take the ibuprofen every 6 hours, please take this with food to help prevent gastrointestinal upset.  If your pain and swelling persist, I suggest following up with an orthopedic for further evaluation.  Please return to clinic for any new or concerning symptoms.    ED Prescriptions     Medication Sig Dispense Auth. Provider   ibuprofen (ADVIL) 600 MG tablet Take 1 tablet (600 mg total) by mouth every 6 (six) hours as needed for up to 7 days. 28 tablet Chrystopher Stangl, [place] N, [place]      PDMP not reviewed this encounter.   Saketh Daubert, [place] N, [place] 06/19/22 [phone]

## 2022-06-19 NOTE — ED Triage Notes (Signed)
Pain in the right wrist onset Thursday with swelling and pain. States has had a knot there for some time. No known falls or injuries.

## 2022-06-19 NOTE — Discharge Instructions (Addendum)
Your x-rays were negative for acute fracture or dislocation.  Please wear the wrist brace throughout the day and at night to help with pain and swelling.  You can take the ibuprofen every 6 hours, please take this with food to help prevent gastrointestinal upset.  If your pain and swelling persist, I suggest following up with an orthopedic for further evaluation.  Please return to clinic for any new or concerning symptoms.

## 2022-06-22 DIAGNOSIS — M67431 Ganglion, right wrist: Secondary | ICD-10-CM | POA: Diagnosis not present

## 2022-06-22 DIAGNOSIS — M25531 Pain in right wrist: Secondary | ICD-10-CM | POA: Diagnosis not present

## 2022-06-25 ENCOUNTER — Encounter (HOSPITAL_BASED_OUTPATIENT_CLINIC_OR_DEPARTMENT_OTHER): Payer: Self-pay | Admitting: Orthopedic Surgery

## 2022-06-25 DIAGNOSIS — M67431 Ganglion, right wrist: Secondary | ICD-10-CM | POA: Diagnosis not present

## 2022-06-25 DIAGNOSIS — M25531 Pain in right wrist: Secondary | ICD-10-CM | POA: Diagnosis not present

## 2022-06-29 NOTE — H&P (Signed)
PREOPERATIVE H&P  Chief Complaint: RIGHT WRIST GANGLION  HPI: Molly Bryant is a 22 y.o. female who presents with a diagnosis of RIGHT WRIST GANGLION. Symptoms are rated as moderate to severe, and have been worsening.  This is significantly impairing activities of daily living.  She has elected for surgical management.   Past Medical History:  Diagnosis Date   Concussion with brief loss of consciousness 11/26/2021   Heart murmur    echo normal 11-02-2021 relased from cardiology   Seizures [facility])    last seizure   Past Surgical History:  Procedure Laterality Date   NO PAST SURGERIES     Social History   Socioeconomic History   Marital status: Single    Spouse name: Not on file   Number of children: Not on file   Years of education: Not on file   Highest education level: Not on file  Occupational History   Not on file  Tobacco Use   Smoking status: Never   Smokeless tobacco: Never  Vaping Use   Vaping Use: Never used  Substance and Sexual Activity   Alcohol use: No   Drug use: Not Currently    Types: Marijuana   Sexual activity: Not Currently    Birth control/protection: None  Other Topics Concern   Not on file  Social History Narrative   Molly Bryant is in [employer] going for her associates.     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     Wants to become a Transport planner.     Lives with mother, brother, and 3 sisters.   Parents are divorced.   Social Determinants of Health   Financial Resource Strain: Not on file  Food Insecurity: Not on file  Transportation Needs: Not on file  Physical Activity: Not on file  Stress: Not on file  Social Connections: Not on file   Family History  Problem Relation Age of Onset   Hypertension Mother    Migraines Mother    Bipolar disorder Paternal Aunt    Seizures Neg Hx    Depression Neg Hx    Anxiety disorder Neg Hx    Schizophrenia Neg Hx    ADD / ADHD Neg Hx    Autism Neg Hx    Allergies   Allergen Reactions   Penicillins Rash   Prior to Admission medications   Medication Sig Start Date End Date Taking? Authorizing Provider  norethindrone-ethinyl estradiol-FE (JUNEL FE 1/20) 1-20 MG-MCG tablet Take 1 tablet by mouth daily. 02/26/22   Doreene Nest, NP  amitriptyline (ELAVIL) 50 MG tablet 25 mg at night If no improvement in symptoms after 3 weeks  than increase dose to 50 mg at night 10/08/19 11/13/19  Mir, Shirlyn Goltz, MD  diazepam (DIASAT) 20 MG GEL LOCK AT 12 5MG   TO BE GIVEN RECTALLY FOR SEIZURE LASTING GREATER THAN 5 MINUTES 11/22/16 11/13/19  [provider]  hyoscyamine (LEVSIN) 0.125 MG tablet Take 1 tablet (0.125 mg total) by mouth every 4 (four) hours as needed. 09/08/19 11/13/19  Fredia Sorrow, NP  midazolam (VERSED) 10 MG/2ML SOLN injection SPRAY 1 ML IN EACH NOSTRIL AS NEEDED FOR SEIZURES LASTING GREATER THAN 5 MINUTES 11/15/16 11/13/19  [provider]     Positive ROS: All other systems have been reviewed and were otherwise negative with the exception of those mentioned in the HPI and as above.  Physical Exam: General: Alert, no acute distress Cardiovascular: No pedal edema Respiratory: No cyanosis,  no use of accessory musculature GI: No organomegaly, abdomen is soft and non-tender Skin: No lesions in the area of chief complaint Neurologic: Sensation intact distally Psychiatric: Patient is competent for consent with normal mood and affect Lymphatic: No axillary or cervical lymphadenopathy  MUSCULOSKELETAL: TTP right dorsal wrist, obvious round prominence to the area, pain with wrist ROM, no erythema, NVI   Imaging: Ultrasound exam in the office showed a hypoechoic encapsulated fluid filled sac consistent with a ganglion cyst that is closely approximating the fourth dorsal compartment. Measured in both transverse and longitudinal axis. It is 0.68 x 1.64 cm.    Assessment: RIGHT WRIST GANGLION  Plan: Plan for Procedure(s): REMOVAL  GANGLION OF WRIST  The risks benefits and alternatives were discussed with the patient including but not limited to the risks of nonoperative treatment, versus surgical intervention including infection, bleeding, nerve injury,  blood clots, cardiopulmonary complications, morbidity, mortality, among others, and they were willing to proceed.   Weightbearing: WBAT RUE Orthopedic devices: none Showering: POD 3 Dressing: reinforce PRN Medicines: Ultram, Tylenol  Discharge: home Follow up: 07/19/22 at 11:45am    Jenne Pane, PA-C Office [phone] 06/29/2022 9:49 AM

## 2022-07-01 ENCOUNTER — Encounter (HOSPITAL_BASED_OUTPATIENT_CLINIC_OR_DEPARTMENT_OTHER): Payer: Self-pay | Admitting: Orthopedic Surgery

## 2022-07-01 ENCOUNTER — Other Ambulatory Visit: Payer: Self-pay

## 2022-07-01 NOTE — Progress Notes (Signed)
Spoke w/ via phone for pre-op interview---pt Lab needs dos----   I stat serum preg            Lab results------lov cardiology for murmur 10-19-2021 dr Corrie Dandy branch epic, echo 11-03-2023 normal, pt released from cardiology, EKG 10-19-2021 epic COVID test -----patient states asymptomatic no test needed Arrive at -------900 am 07-06-2022 NPO after MN NO Solid Food.  Clear liquids from MN until--- Med rec completed Medications to take morning of surgery -----none Diabetic medication -----n/a Patient instructed no nail polish to be worn day of surgery Patient instructed to bring photo id and insurance card day of surgery Patient aware to have Driver (ride ) / caregiver  mother Firefighter, pt vocalized understanding all instructions that were given at this phone interview. Patient denies shortness of breath, chest pain, fever, cough at this phone interview.

## 2022-07-06 ENCOUNTER — Other Ambulatory Visit: Payer: Self-pay

## 2022-07-06 ENCOUNTER — Other Ambulatory Visit (HOSPITAL_COMMUNITY): Payer: Self-pay

## 2022-07-06 ENCOUNTER — Ambulatory Visit (HOSPITAL_BASED_OUTPATIENT_CLINIC_OR_DEPARTMENT_OTHER): Payer: 59 | Admitting: Anesthesiology

## 2022-07-06 ENCOUNTER — Encounter (HOSPITAL_BASED_OUTPATIENT_CLINIC_OR_DEPARTMENT_OTHER)
Admission: RE | Disposition: A | Discharge status: Home / Self Care | Payer: Self-pay | Source: Home / Self Care | Attending: Orthopedic Surgery

## 2022-07-06 ENCOUNTER — Encounter (HOSPITAL_BASED_OUTPATIENT_CLINIC_OR_DEPARTMENT_OTHER): Payer: Self-pay | Admitting: Orthopedic Surgery

## 2022-07-06 ENCOUNTER — Ambulatory Visit (HOSPITAL_BASED_OUTPATIENT_CLINIC_OR_DEPARTMENT_OTHER)
Admission: RE | Admit: 2022-07-06 | Discharge: 2022-07-06 | Disposition: A | Discharge status: Home / Self Care | Payer: 59 | Attending: Orthopedic Surgery | Admitting: Orthopedic Surgery

## 2022-07-06 DIAGNOSIS — M67431 Ganglion, right wrist: Secondary | ICD-10-CM | POA: Diagnosis not present

## 2022-07-06 DIAGNOSIS — Z01818 Encounter for other preprocedural examination: Secondary | ICD-10-CM

## 2022-07-06 DIAGNOSIS — R569 Unspecified convulsions: Secondary | ICD-10-CM | POA: Diagnosis not present

## 2022-07-06 HISTORY — DX: Ganglion, unspecified site: M67.40

## 2022-07-06 HISTORY — DX: Presence of spectacles and contact lenses: Z97.3

## 2022-07-06 HISTORY — DX: Anemia, unspecified: D64.9

## 2022-07-06 HISTORY — DX: Cardiac murmur, unspecified: R01.1

## 2022-07-06 HISTORY — PX: GANGLION CYST EXCISION: SHX1691

## 2022-07-06 LAB — POCT PREGNANCY, URINE: Preg Test, Ur: NEGATIVE

## 2022-07-06 SURGERY — EXCISION, GANGLION CYST, WRIST
Anesthesia: General | Site: Wrist | Laterality: Right

## 2022-07-06 MED ORDER — PROPOFOL 500 MG/50ML IV EMUL
INTRAVENOUS | Status: DC | PRN
  Administered 2022-07-06: 200 ug/kg/min via INTRAVENOUS

## 2022-07-06 MED ORDER — LACTATED RINGERS IV SOLN: INTRAVENOUS | Status: DC

## 2022-07-06 MED ORDER — PROPOFOL 10 MG/ML IV BOLUS
INTRAVENOUS | Status: DC | PRN
  Administered 2022-07-06 (×2): 40 mg via INTRAVENOUS

## 2022-07-06 MED ORDER — OXYCODONE HCL 5 MG PO TABS
ORAL_TABLET | ORAL | Status: AC
  Filled 2022-07-06: qty 1

## 2022-07-06 MED ORDER — ACETAMINOPHEN 500 MG PO TABS
1000.0000 mg | ORAL_TABLET | Freq: Four times a day (QID) | ORAL | 0 refills | Status: AC | PRN
  Filled 2022-07-06: qty 80, 10d supply, fill #0

## 2022-07-06 MED ORDER — BUPIVACAINE HCL 0.5 % IJ SOLN
INTRAMUSCULAR | Status: DC | PRN
  Administered 2022-07-06: 10 mL

## 2022-07-06 MED ORDER — FENTANYL CITRATE (PF) 100 MCG/2ML IJ SOLN
INTRAMUSCULAR | Status: AC
  Filled 2022-07-06: qty 2

## 2022-07-06 MED ORDER — CEFAZOLIN SODIUM-DEXTROSE 2-4 GM/100ML-% IV SOLN
2.0000 g | INTRAVENOUS | Status: AC
  Administered 2022-07-06: 2 g via INTRAVENOUS

## 2022-07-06 MED ORDER — PROPOFOL 1000 MG/100ML IV EMUL
INTRAVENOUS | Status: AC
  Filled 2022-07-06: qty 200

## 2022-07-06 MED ORDER — MIDAZOLAM HCL 2 MG/2ML IJ SOLN
INTRAMUSCULAR | Status: DC | PRN
  Administered 2022-07-06: 2 mg via INTRAVENOUS

## 2022-07-06 MED ORDER — DEXMEDETOMIDINE HCL IN NACL 80 MCG/20ML IV SOLN
INTRAVENOUS | Status: DC | PRN
  Administered 2022-07-06: 8 ug via INTRAVENOUS

## 2022-07-06 MED ORDER — ONDANSETRON HCL 4 MG/2ML IJ SOLN
INTRAMUSCULAR | Status: DC | PRN
  Administered 2022-07-06: 4 mg via INTRAVENOUS

## 2022-07-06 MED ORDER — LIDOCAINE 2% (20 MG/ML) 5 ML SYRINGE
INTRAMUSCULAR | Status: DC | PRN
  Administered 2022-07-06: 40 mg via INTRAVENOUS

## 2022-07-06 MED ORDER — KETOROLAC TROMETHAMINE 30 MG/ML IJ SOLN
INTRAMUSCULAR | Status: AC
  Filled 2022-07-06: qty 1

## 2022-07-06 MED ORDER — DEXAMETHASONE SODIUM PHOSPHATE 10 MG/ML IJ SOLN
INTRAMUSCULAR | Status: AC
  Filled 2022-07-06: qty 2

## 2022-07-06 MED ORDER — DEXAMETHASONE SODIUM PHOSPHATE 10 MG/ML IJ SOLN
INTRAMUSCULAR | Status: DC | PRN
  Administered 2022-07-06: 10 mg via INTRAVENOUS

## 2022-07-06 MED ORDER — MIDAZOLAM HCL 2 MG/2ML IJ SOLN
INTRAMUSCULAR | Status: AC
  Filled 2022-07-06: qty 2

## 2022-07-06 MED ORDER — OXYCODONE HCL 5 MG PO TABS
5.0000 mg | ORAL_TABLET | Freq: Once | ORAL | Status: AC | PRN
  Administered 2022-07-06: 5 mg via ORAL

## 2022-07-06 MED ORDER — ACETAMINOPHEN 500 MG PO TABS
ORAL_TABLET | ORAL | Status: AC
  Filled 2022-07-06: qty 2

## 2022-07-06 MED ORDER — ACETAMINOPHEN 500 MG PO TABS
1000.0000 mg | ORAL_TABLET | Freq: Once | ORAL | Status: AC
  Administered 2022-07-06: 1000 mg via ORAL

## 2022-07-06 MED ORDER — ONDANSETRON HCL 4 MG/2ML IJ SOLN
INTRAMUSCULAR | Status: AC
  Filled 2022-07-06: qty 6

## 2022-07-06 MED ORDER — FENTANYL CITRATE (PF) 100 MCG/2ML IJ SOLN
25.0000 ug | INTRAMUSCULAR | Status: DC | PRN
  Administered 2022-07-06 (×2): 25 ug via INTRAVENOUS

## 2022-07-06 MED ORDER — PHENYLEPHRINE 80 MCG/ML (10ML) SYRINGE FOR IV PUSH (FOR BLOOD PRESSURE SUPPORT)
PREFILLED_SYRINGE | INTRAVENOUS | Status: AC
  Filled 2022-07-06: qty 10

## 2022-07-06 MED ORDER — OXYCODONE HCL 5 MG/5ML PO SOLN: 5.0000 mg | Freq: Once | ORAL | Status: AC | PRN

## 2022-07-06 MED ORDER — CEFAZOLIN SODIUM-DEXTROSE 2-4 GM/100ML-% IV SOLN
INTRAVENOUS | Status: AC
  Filled 2022-07-06: qty 100

## 2022-07-06 MED ORDER — TRAMADOL HCL 50 MG PO TABS
50.0000 mg | ORAL_TABLET | Freq: Three times a day (TID) | ORAL | 0 refills | Status: DC | PRN
  Filled 2022-07-06: qty 20, 7d supply, fill #0

## 2022-07-06 MED ORDER — LIDOCAINE HCL (PF) 2 % IJ SOLN
INTRAMUSCULAR | Status: AC
  Filled 2022-07-06: qty 15

## 2022-07-06 MED ORDER — MELOXICAM 15 MG PO TABS
15.0000 mg | ORAL_TABLET | Freq: Every day | ORAL | 0 refills | Status: DC | PRN
  Filled 2022-07-06: qty 30, 30d supply, fill #0

## 2022-07-06 MED ORDER — FENTANYL CITRATE (PF) 250 MCG/5ML IJ SOLN
INTRAMUSCULAR | Status: DC | PRN
  Administered 2022-07-06: 25 ug via INTRAVENOUS
  Administered 2022-07-06: 50 ug via INTRAVENOUS
  Administered 2022-07-06: 25 ug via INTRAVENOUS

## 2022-07-06 MED ORDER — 0.9 % SODIUM CHLORIDE (POUR BTL) OPTIME
TOPICAL | Status: DC | PRN
  Administered 2022-07-06: 1000 mL

## 2022-07-06 SURGICAL SUPPLY — 44 items
APL PRP STRL LF DISP 70% ISPRP (MISCELLANEOUS) ×1
BANDAGE ACE 3X5.8 VEL STRL LF (GAUZE/BANDAGES/DRESSINGS) ×1 IMPLANT
BLADE SURG 11 STRL SS (BLADE) IMPLANT
BLADE SURG 15 STRL LF DISP TIS (BLADE) ×1 IMPLANT
BLADE SURG 15 STRL SS (BLADE) ×1
BNDG CMPR 9X4 STRL LF SNTH (GAUZE/BANDAGES/DRESSINGS)
BNDG COHESIVE 3X5 TAN STRL LF (GAUZE/BANDAGES/DRESSINGS) IMPLANT
BNDG ESMARK 4X9 LF (GAUZE/BANDAGES/DRESSINGS) IMPLANT
BNDG GAUZE DERMACEA FLUFF 4 (GAUZE/BANDAGES/DRESSINGS) ×1 IMPLANT
BNDG GZE DERMACEA 4 6PLY (GAUZE/BANDAGES/DRESSINGS) ×1
CHLORAPREP W/TINT 26 (MISCELLANEOUS) ×1 IMPLANT
CORD BIPOLAR FORCEPS 12FT (ELECTRODE) IMPLANT
COVER BACK TABLE 60X90IN (DRAPES) ×1 IMPLANT
CUFF TOURN SGL QUICK 18X4 (TOURNIQUET CUFF) ×1 IMPLANT
DRAPE EXTREMITY T 121X128X90 (DISPOSABLE) ×1 IMPLANT
DRAPE IMP U-DRAPE 54X76 (DRAPES) ×1 IMPLANT
DRAPE SURG 17X23 STRL (DRAPES) ×1 IMPLANT
DRSG EMULSION OIL 3X3 NADH (GAUZE/BANDAGES/DRESSINGS) ×1 IMPLANT
GAUZE 4X4 16PLY ~~LOC~~+RFID DBL (SPONGE) ×1 IMPLANT
GAUZE PAD ABD 8X10 STRL (GAUZE/BANDAGES/DRESSINGS) IMPLANT
GAUZE SPONGE 4X4 12PLY STRL (GAUZE/BANDAGES/DRESSINGS) ×1 IMPLANT
GLOVE BIO SURGEON STRL SZ7.5 (GLOVE) ×2 IMPLANT
GLOVE BIOGEL PI IND STRL 7.5 (GLOVE) ×1 IMPLANT
GLOVE BIOGEL PI IND STRL 8 (GLOVE) ×2 IMPLANT
GLOVE SURG SS PI 7.5 STRL IVOR (GLOVE) ×1 IMPLANT
GOWN STRL REUS W/TWL LRG LVL3 (GOWN DISPOSABLE) ×1 IMPLANT
GOWN STRL REUS W/TWL XL LVL3 (GOWN DISPOSABLE) ×1 IMPLANT
KIT TURNOVER CYSTO (KITS) ×1 IMPLANT
NDL HYPO 25X1 1.5 SAFETY (NEEDLE) IMPLANT
NEEDLE HYPO 25X1 1.5 SAFETY (NEEDLE) IMPLANT
NS IRRIG 1000ML POUR BTL (IV SOLUTION) ×1 IMPLANT
PACK BASIN DAY SURGERY FS (CUSTOM PROCEDURE TRAY) ×1 IMPLANT
PAD CAST 3X4 CTTN HI CHSV (CAST SUPPLIES) IMPLANT
PADDING CAST ABS COTTON 3X4 (CAST SUPPLIES) IMPLANT
PADDING CAST ABS COTTON 4X4 ST (CAST SUPPLIES) ×1 IMPLANT
PADDING CAST COTTON 3X4 STRL (CAST SUPPLIES)
SLEEVE SCD COMPRESS KNEE MED (STOCKING) ×1 IMPLANT
STRIP CLOSURE SKIN 1/2X4 (GAUZE/BANDAGES/DRESSINGS) ×2 IMPLANT
SUT ETHILON 3 0 PS 1 (SUTURE) IMPLANT
SUT MNCRL AB 3-0 PS2 18 (SUTURE) IMPLANT
SUT MNCRL AB 4-0 PS2 18 (SUTURE) IMPLANT
SYR BULB EAR ULCER 3OZ GRN STR (SYRINGE) ×1 IMPLANT
SYR CONTROL 10ML LL (SYRINGE) IMPLANT
UNDERPAD 30X36 HEAVY ABSORB (UNDERPADS AND DIAPERS) ×1 IMPLANT

## 2022-07-06 NOTE — Anesthesia Procedure Notes (Signed)
Procedure Name: LMA Insertion Date/Time: 07/06/2022 11:54 AM  Performed by: Dairl Ponder, CRNAPre-anesthesia Checklist: Patient identified, Emergency Drugs available, Suction available and Patient being monitored Patient Re-evaluated:Patient Re-evaluated prior to induction Oxygen Delivery Method: Circle System Utilized Preoxygenation: Pre-oxygenation with 100% oxygen Induction Type: IV induction Ventilation: Mask ventilation without difficulty LMA: LMA inserted LMA Size: 3.0 Number of attempts: 1 Airway Equipment and Method: Bite block Placement Confirmation: positive ETCO2 Tube secured with: Tape Dental Injury: Teeth and Oropharynx as per pre-operative assessment

## 2022-07-06 NOTE — Interval H&P Note (Signed)
History and Physical Interval Note:  07/06/2022 9:04 AM  Molly Bryant  has presented today for surgery, with the diagnosis of RIGHT WRIST GANGLION.  The various methods of treatment have been discussed with the patient and family. After consideration of risks, benefits and other options for treatment, the patient has consented to  Procedure(s): REMOVAL GANGLION OF WRIST (Right) as a surgical intervention.  The patient's history has been reviewed, patient examined, no change in status, stable for surgery.  I have reviewed the patient's chart and labs.  Questions were answered to the patient's satisfaction.     Sheral Apley

## 2022-07-06 NOTE — Discharge Instructions (Addendum)
POST-OPERATIVE OPIOID TAPER INSTRUCTIONS: It is important to wean off of your opioid medication as soon as possible. If you do not need pain medication after your surgery it is ok to stop day one. Opioids include: Codeine, Hydrocodone(Norco, Vicodin), Oxycodone(Percocet, oxycontin) and hydromorphone amongst others.  Long term and even short term use of opiods can cause: Increased pain response Dependence Constipation Depression Respiratory depression And more.  Withdrawal symptoms can include Flu like symptoms Nausea, vomiting And more Techniques to manage these symptoms Hydrate well Eat regular healthy meals Stay active Use relaxation techniques(deep breathing, meditating, yoga) Do Not substitute Alcohol to help with tapering If you have been on opioids for less than two weeks and do not have pain than it is ok to stop all together.  Plan to wean off of opioids This plan should start within one week post op of your joint replacement. Maintain the same interval or time between taking each dose and first decrease the dose.  Cut the total daily intake of opioids by one tablet each day Next start to increase the time between doses. The last dose that should be eliminated is the evening dose.     Post Anesthesia Home Care Instructions  Activity: Get plenty of rest for the remainder of the day. A responsible individual must stay with you for 24 hours following the procedure.  For the next 24 hours, DO NOT: -Drive a car -Operate machinery -Drink alcoholic beverages -Take any medication unless instructed by your physician -Make any legal decisions or sign important papers.  Meals: Start with liquid foods such as gelatin or soup. Progress to regular foods as tolerated. Avoid greasy, spicy, heavy foods. If nausea and/or vomiting occur, drink only clear liquids until the nausea and/or vomiting subsides. Call your physician if vomiting continues.  Special Instructions/Symptoms: Your  throat may feel dry or sore from the anesthesia or the breathing tube placed in your throat during surgery. If this causes discomfort, gargle with warm salt water. The discomfort should disappear within 24 hours.       

## 2022-07-06 NOTE — Transfer of Care (Signed)
Immediate Anesthesia Transfer of Care Note  Patient: Molly Bryant  Procedure(s) Performed: REMOVAL GANGLION OF WRIST (Right: Wrist)  Patient Location: PACU  Anesthesia Type:General  Level of Consciousness: awake, alert , and oriented  Airway & Oxygen Therapy: Patient Spontanous Breathing  Post-op Assessment: Report given to RN and Post -op Vital signs reviewed and stable  Post vital signs: Reviewed and stable  Last Vitals:  Vitals Value Taken Time  BP 109/63 07/06/22 1315  Temp 36.5 C 07/06/22 1255  Pulse 91 07/06/22 1317  Resp 13 07/06/22 1317  SpO2 96 % 07/06/22 1317  Vitals shown include unvalidated device data.  Last Pain:  Vitals:   07/06/22 1311  TempSrc:   PainSc: 7       Patients Stated Pain Goal: 5 (07/06/22 0932)  Complications: No notable events documented.

## 2022-07-06 NOTE — Anesthesia Preprocedure Evaluation (Addendum)
Anesthesia Evaluation  Patient identified by MRN, date of birth, ID band Patient awake    Reviewed: Allergy & Precautions, NPO status , Patient's Chart, lab work & pertinent test results  Airway Mallampati: II  TM Distance: >3 FB Neck ROM: Full    Dental no notable dental hx.    Pulmonary neg pulmonary ROS   Pulmonary exam normal breath sounds clear to auscultation       Cardiovascular negative cardio ROS Normal cardiovascular exam Rhythm:Regular Rate:Normal     Neuro/Psych Seizures - (last seizure 2019), Well Controlled,   negative psych ROS   GI/Hepatic negative GI ROS, Neg liver ROS,,,  Endo/Other  negative endocrine ROS    Renal/GU negative Renal ROS  negative genitourinary   Musculoskeletal negative musculoskeletal ROS (+)    Abdominal   Peds  Hematology negative hematology ROS (+)   Anesthesia Other Findings   Reproductive/Obstetrics                             Anesthesia Physical Anesthesia Plan  ASA: 2  Anesthesia Plan: General   Post-op Pain Management: Tylenol PO (pre-op)*   Induction: Intravenous  PONV Risk Score and Plan: 3 and Ondansetron, Dexamethasone, Midazolam and TIVA  Airway Management Planned: LMA  Additional Equipment:   Intra-op Plan:   Post-operative Plan: Extubation in OR  Informed Consent: I have reviewed the patients History and Physical, chart, labs and discussed the procedure including the risks, benefits and alternatives for the proposed anesthesia with the patient or authorized representative who has indicated his/her understanding and acceptance.     Dental advisory given  Plan Discussed with: CRNA  Anesthesia Plan Comments:        Anesthesia Quick Evaluation

## 2022-07-07 ENCOUNTER — Encounter (HOSPITAL_BASED_OUTPATIENT_CLINIC_OR_DEPARTMENT_OTHER): Payer: Self-pay | Admitting: Orthopedic Surgery

## 2022-07-07 NOTE — Anesthesia Postprocedure Evaluation (Signed)
Anesthesia Post Note  Patient: Molly Bryant  Procedure(s) Performed: REMOVAL GANGLION OF WRIST (Right: Wrist)     Patient location during evaluation: PACU Anesthesia Type: General Level of consciousness: awake and alert Pain management: pain level controlled Vital Signs Assessment: post-procedure vital signs reviewed and stable Respiratory status: spontaneous breathing, nonlabored ventilation, respiratory function stable and patient connected to nasal cannula oxygen Cardiovascular status: blood pressure returned to baseline and stable Postop Assessment: no apparent nausea or vomiting Anesthetic complications: no  No notable events documented.  Last Vitals:  Vitals:   07/06/22 1330 07/06/22 1400  BP: 111/61 115/66  Pulse: 92 79  Resp: 13 15  Temp: 36.6 C 36.7 C  SpO2: 100% 99%    Last Pain:  Vitals:   07/07/22 1124  TempSrc:   PainSc: 8    Pain Goal: Patients Stated Pain Goal: 4 (07/06/22 1400)                 Undray Allman L Kiyaan Haq

## 2022-07-07 NOTE — Op Note (Signed)
07/06/2022  8:22 AM  PATIENT:  Molly Bryant    PRE-OPERATIVE DIAGNOSIS:  RIGHT WRIST GANGLION  POST-OPERATIVE DIAGNOSIS:  Same  PROCEDURE:  REMOVAL GANGLION OF WRIST  SURGEON:  Sheral Apley, MD  ASSISTANT: Levester Fresh, PA-C, he was present and scrubbed throughout the case, critical for completion in a timely fashion, and for retraction, instrumentation, and closure.   ANESTHESIA:   lma  PREOPERATIVE INDICATIONS:  Shabria Tilles is a  22 y.o. female with a diagnosis of RIGHT WRIST GANGLION who failed conservative measures and elected for surgical management.    The risks benefits and alternatives were discussed with the patient preoperatively including but not limited to the risks of infection, bleeding, nerve injury, cardiopulmonary complications, the need for revision surgery, among others, and the patient was willing to proceed.  OPERATIVE IMPLANTS: nonne  OPERATIVE FINDINGS: ganglion fluid  BLOOD LOSS: min  COMPLICATIONS: none  TOURNIQUET TIME:  OPERATIVE PROCEDURE:  Patient was identified in the preoperative holding area and site was marked by me She was transported to the operating theater and placed on the table in supine position taking care to pad all bony prominences. After a preincinduction time out anesthesia was induced. The RUE extremity was prepped and draped in normal sterile fashion and a pre-incision timeout was performed. She received ancef for preoperative antibiotics.   The limb was exsanguinated and tourniquet was inflated to 200 mmHg  I made a transverse incision over her dorsal wrist ganglion.  I protected neurovascular structures I then retracted the fourth dorsal compartment tendons to the ulnar side as the ganglion was sitting just below this.  I elevated it distally off of soft tissues and noted the stalk just over the radiocarpal joint.  I then excised the stalk and soft tissue connections of the ganglion.  I examined all  neurovascular and tendinous structures these were all intact  I thoroughly irrigated the incision and closed the skin  Bulky dressing was applied she returned to PACU in stable condition  POST OPERATIVE PLAN: Dressing for a week and then advance activity

## 2022-07-09 DIAGNOSIS — M67431 Ganglion, right wrist: Secondary | ICD-10-CM | POA: Diagnosis not present

## 2022-07-14 DIAGNOSIS — M67431 Ganglion, right wrist: Secondary | ICD-10-CM | POA: Diagnosis not present

## 2022-07-21 ENCOUNTER — Encounter: Payer: Self-pay | Admitting: Primary Care

## 2022-07-21 ENCOUNTER — Ambulatory Visit (INDEPENDENT_AMBULATORY_CARE_PROVIDER_SITE_OTHER): Payer: 59 | Admitting: Primary Care

## 2022-07-21 ENCOUNTER — Other Ambulatory Visit (HOSPITAL_COMMUNITY): Payer: Self-pay

## 2022-07-21 VITALS — BP 124/66 | HR 94 | Temp 98.6°F | Ht 60.0 in | Wt 134.0 lb

## 2022-07-21 DIAGNOSIS — M67431 Ganglion, right wrist: Secondary | ICD-10-CM

## 2022-07-21 DIAGNOSIS — N926 Irregular menstruation, unspecified: Secondary | ICD-10-CM | POA: Diagnosis not present

## 2022-07-21 MED ORDER — NORGESTIMATE-ETH ESTRADIOL 0.25-35 MG-MCG PO TABS
1.0000 | ORAL_TABLET | Freq: Every day | ORAL | 0 refills | Status: DC
Start: 2022-07-21 — End: 2022-10-13
  Filled 2022-07-21: qty 84, 84d supply, fill #0
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
## 2022-07-21 NOTE — Assessment & Plan Note (Signed)
Status post excision per orthopedics.  Incision site appears to have healed well, no complications. Work note provided for her to return as scheduled.

## 2022-07-21 NOTE — Assessment & Plan Note (Signed)
Continued. Unclear etiology.  Will switch hormone levels of OCPs to see if this helps.  Stop Junel 1/20, start Sprintec 0.25/35. We also discussed a transvaginal/pelvic ultrasound.  She declines this for now but agrees to proceed if the Sprintec pills are ineffective.  Labs pending today for CBC, BMP, A1c, TSH.

## 2022-07-21 NOTE — Patient Instructions (Addendum)
Start your new birth control pill pack once you complete this current pack.  Stop by the lab prior to leaving today. I will notify you of your results once received.   If no improvement then we will proceed with the ultrasound as discussed.  Please keep me updated!

## 2022-07-22 LAB — HEMOGLOBIN A1C: Hgb A1c MFr Bld: 5.4 % (ref 4.6–6.5)

## 2022-07-22 LAB — CBC
HCT: 38.4 % (ref 36.0–49.0)
Hemoglobin: 12.5 g/dL (ref 12.0–16.0)
MCHC: 32.6 g/dL (ref 31.0–37.0)
MCV: 88.5 fl (ref 78.0–98.0)
Platelets: 277 10*3/uL (ref 150.0–575.0)
RBC: 4.34 Mil/uL (ref 3.80–5.70)
RDW: 14.3 % (ref 11.4–15.5)
WBC: 6 10*3/uL (ref 4.5–13.5)

## 2022-07-22 LAB — BASIC METABOLIC PANEL
BUN: 9 mg/dL (ref 6–23)
CO2: 28 mEq/L (ref 19–32)
Calcium: 9.4 mg/dL (ref 8.4–10.5)
Chloride: 104 mEq/L (ref 96–112)
Creatinine, Ser: 0.72 mg/dL (ref 0.40–1.20)
GFR: 121.15 mL/min (ref >60.00)
Glucose, Bld: 97 mg/dL (ref 70–99)
Potassium: 3.9 mEq/L (ref 3.5–5.1)
Sodium: 139 mEq/L (ref 135–145)

## 2022-07-22 LAB — TSH: TSH: 0.74 u[IU]/mL (ref 0.40–5.00)

## 2022-08-25 ENCOUNTER — Encounter (HOSPITAL_COMMUNITY): Payer: Self-pay

## 2022-08-25 ENCOUNTER — Emergency Department (HOSPITAL_COMMUNITY): Payer: 59

## 2022-08-25 ENCOUNTER — Ambulatory Visit: Payer: 59 | Admitting: Primary Care

## 2022-08-25 ENCOUNTER — Other Ambulatory Visit (HOSPITAL_COMMUNITY): Payer: Self-pay

## 2022-08-25 ENCOUNTER — Other Ambulatory Visit: Payer: Self-pay

## 2022-08-25 ENCOUNTER — Emergency Department (HOSPITAL_COMMUNITY)
Admission: EM | Admit: 2022-08-25 | Discharge: 2022-08-25 | Disposition: A | Discharge status: Home / Self Care | Payer: 59 | Attending: Emergency Medicine | Admitting: Emergency Medicine

## 2022-08-25 DIAGNOSIS — S80812A Abrasion, left lower leg, initial encounter: Secondary | ICD-10-CM | POA: Insufficient documentation

## 2022-08-25 DIAGNOSIS — S301XXA Contusion of abdominal wall, initial encounter: Secondary | ICD-10-CM | POA: Diagnosis not present

## 2022-08-25 DIAGNOSIS — S80811A Abrasion, right lower leg, initial encounter: Secondary | ICD-10-CM | POA: Insufficient documentation

## 2022-08-25 DIAGNOSIS — S3991XA Unspecified injury of abdomen, initial encounter: Secondary | ICD-10-CM | POA: Diagnosis not present

## 2022-08-25 DIAGNOSIS — S50812A Abrasion of left forearm, initial encounter: Secondary | ICD-10-CM | POA: Diagnosis not present

## 2022-08-25 DIAGNOSIS — Y92410 Unspecified street and highway as the place of occurrence of the external cause: Secondary | ICD-10-CM | POA: Insufficient documentation

## 2022-08-25 DIAGNOSIS — Z041 Encounter for examination and observation following transport accident: Secondary | ICD-10-CM | POA: Diagnosis not present

## 2022-08-25 DIAGNOSIS — S3993XA Unspecified injury of pelvis, initial encounter: Secondary | ICD-10-CM | POA: Diagnosis not present

## 2022-08-25 DIAGNOSIS — R000 Tachycardia, unspecified: Secondary | ICD-10-CM | POA: Insufficient documentation

## 2022-08-25 DIAGNOSIS — S299XXA Unspecified injury of thorax, initial encounter: Secondary | ICD-10-CM | POA: Diagnosis not present

## 2022-08-25 LAB — CBC
HCT: 40.5 % (ref 36.0–46.0)
Hemoglobin: 13.6 g/dL (ref 12.0–15.0)
MCH: 30.2 pg (ref 26.0–34.0)
MCHC: 33.6 g/dL (ref 30.0–36.0)
MCV: 90 fL (ref 80.0–100.0)
Platelets: 285 10*3/uL (ref 150–400)
RBC: 4.5 MIL/uL (ref 3.87–5.11)
RDW: 12.3 % (ref 11.5–15.5)
WBC: 7 10*3/uL (ref 4.0–10.5)
nRBC: 0 % (ref 0.0–0.2)

## 2022-08-25 LAB — URINALYSIS, ROUTINE W REFLEX MICROSCOPIC
Bilirubin Urine: NEGATIVE
Glucose, UA: NEGATIVE mg/dL
Ketones, ur: NEGATIVE mg/dL
Leukocytes,Ua: NEGATIVE
Nitrite: NEGATIVE
Protein, ur: NEGATIVE mg/dL
RBC / HPF: >50 RBC/hpf (ref 0–5)
Specific Gravity, Urine: >1.046 — ABNORMAL HIGH (ref 1.005–1.030)
pH: 5 (ref 5.0–8.0)

## 2022-08-25 LAB — COMPREHENSIVE METABOLIC PANEL
ALT: 12 U/L (ref 0–44)
AST: 22 U/L (ref 15–41)
Albumin: 3.8 g/dL (ref 3.5–5.0)
Alkaline Phosphatase: 47 U/L (ref 38–126)
Anion gap: 13 (ref 5–15)
BUN: 10 mg/dL (ref 6–20)
CO2: 19 mmol/L — ABNORMAL LOW (ref 22–32)
Calcium: 8.9 mg/dL (ref 8.9–10.3)
Chloride: 105 mmol/L (ref 98–111)
Creatinine, Ser: 0.85 mg/dL (ref 0.44–1.00)
GFR, Estimated: >60 mL/min (ref >60)
Glucose, Bld: 142 mg/dL — ABNORMAL HIGH (ref 70–99)
Potassium: 3.2 mmol/L — ABNORMAL LOW (ref 3.5–5.1)
Sodium: 137 mmol/L (ref 135–145)
Total Bilirubin: 0.4 mg/dL (ref 0.3–1.2)
Total Protein: 7.5 g/dL (ref 6.5–8.1)

## 2022-08-25 LAB — I-STAT CHEM 8, ED
BUN: 8 mg/dL (ref 6–20)
Calcium, Ion: 1.15 mmol/L (ref 1.15–1.40)
Chloride: 108 mmol/L (ref 98–111)
Creatinine, Ser: 0.6 mg/dL (ref 0.44–1.00)
Glucose, Bld: 138 mg/dL — ABNORMAL HIGH (ref 70–99)
HCT: 41 % (ref 36.0–46.0)
Hemoglobin: 13.9 g/dL (ref 12.0–15.0)
Potassium: 3.2 mmol/L — ABNORMAL LOW (ref 3.5–5.1)
Sodium: 141 mmol/L (ref 135–145)
TCO2: 19 mmol/L — ABNORMAL LOW (ref 22–32)

## 2022-08-25 LAB — PROTIME-INR
INR: 1.1 (ref 0.8–1.2)
Prothrombin Time: 14.2 seconds (ref 11.4–15.2)

## 2022-08-25 LAB — SAMPLE TO BLOOD BANK

## 2022-08-25 LAB — I-STAT CG4 LACTIC ACID, ED: Lactic Acid, Venous: 3.5 mmol/L (ref 0.5–1.9)

## 2022-08-25 LAB — ETHANOL: Alcohol, Ethyl (B): <10 mg/dL (ref <10)

## 2022-08-25 LAB — HCG, SERUM, QUALITATIVE: Preg, Serum: NEGATIVE

## 2022-08-25 MED ORDER — METHOCARBAMOL 500 MG PO TABS
500.0000 mg | ORAL_TABLET | Freq: Three times a day (TID) | ORAL | 0 refills | Status: DC | PRN
  Filled 2022-08-25: qty 20, 7d supply, fill #0

## 2022-08-25 MED ORDER — IBUPROFEN 600 MG PO TABS
600.0000 mg | ORAL_TABLET | Freq: Four times a day (QID) | ORAL | 0 refills | Status: DC | PRN
  Filled 2022-08-25: qty 20, 5d supply, fill #0

## 2022-08-25 MED ORDER — FENTANYL CITRATE PF 50 MCG/ML IJ SOSY
25.0000 ug | PREFILLED_SYRINGE | Freq: Once | INTRAMUSCULAR | Status: AC
  Administered 2022-08-25: 25 ug via INTRAVENOUS
  Filled 2022-08-25: qty 1

## 2022-08-25 MED ORDER — IOHEXOL 350 MG/ML SOLN
75.0000 mL | Freq: Once | INTRAVENOUS | Status: AC | PRN
  Administered 2022-08-25: 75 mL via INTRAVENOUS

## 2022-08-25 NOTE — ED Notes (Signed)
Patient ambulated to the bathroom. Sore, but no distress.
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
## 2022-08-25 NOTE — ED Notes (Signed)
Patient says pain is "the same" but she appears comfortable and drowsy.
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
## 2022-08-26 ENCOUNTER — Encounter: Payer: Self-pay | Admitting: Primary Care

## 2022-08-27 ENCOUNTER — Ambulatory Visit (INDEPENDENT_AMBULATORY_CARE_PROVIDER_SITE_OTHER): Payer: 59 | Admitting: Primary Care

## 2022-08-27 ENCOUNTER — Encounter: Payer: Self-pay | Admitting: Primary Care

## 2022-08-27 DIAGNOSIS — F411 Generalized anxiety disorder: Secondary | ICD-10-CM

## 2022-08-27 DIAGNOSIS — F430 Acute stress reaction: Secondary | ICD-10-CM

## 2022-08-27 HISTORY — DX: Generalized anxiety disorder: F41.1

## 2022-08-27 MED ORDER — CYCLOBENZAPRINE HCL 5 MG PO TABS
5.0000 mg | ORAL_TABLET | Freq: Three times a day (TID) | ORAL | 0 refills | Status: DC | PRN
Start: 2022-08-27 — End: 2022-12-08

## 2022-08-27 MED ORDER — HYDROXYZINE HCL 10 MG PO TABS: 10.0000 mg | ORAL_TABLET | Freq: Two times a day (BID) | ORAL | 0 refills | Status: DC | PRN

## 2022-08-27 NOTE — Addendum Note (Signed)
Addended by: Doreene Nest on: 08/27/2022 04:08 PM   Modules accepted: Orders

## 2022-08-27 NOTE — Patient Instructions (Addendum)
Start taking Tylenol 500 mg and Ibuprofen 600 mg together every 8 hours for pain.  You may take the cyclobenzaprine muscle relaxer every 8 hours as needed. Take 1 or 2 pills. This may cause drowsiness.  You may take the hydroxyzine 10 mg tablet twice daily for anxiety or sleep. This may cause drowsiness.   Remember to stretch and walk when able.  It was a pleasure to see you today!
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
## 2022-09-01 ENCOUNTER — Ambulatory Visit: Payer: 59 | Admitting: Family Medicine

## 2022-09-01 ENCOUNTER — Encounter: Payer: Self-pay | Admitting: Family Medicine

## 2022-09-01 VITALS — BP 120/60 | HR 89 | Temp 98.2°F | Ht 60.0 in | Wt 133.2 lb

## 2022-09-01 DIAGNOSIS — S060X0D Concussion without loss of consciousness, subsequent encounter: Secondary | ICD-10-CM | POA: Diagnosis not present

## 2022-09-01 DIAGNOSIS — G44309 Post-traumatic headache, unspecified, not intractable: Secondary | ICD-10-CM | POA: Diagnosis not present
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
## 2022-09-01 NOTE — Patient Instructions (Signed)
Mild Traumatic Brain Injury (CONCUSSSION):  Spencer Copland, MD, [facility] Adapted from [facility], [facility], and [employer]  Think of the human brain almost as an egg yolk and your skull as an egg shell. When your head or body takes a hit, it can cause your brain to shake around inside your skull, injuring the brain. A concussion is not only caused by a hit to the head, but can also be caused by an impact to the body that ends up shaking your brain in your skull, such as whiplash.  A common misconception about concussion is that one loses consciousness. However, loss of consciousness occurs in only less than 10% of concussion cases.  Concussive symptoms typically resolve in 7 to 10 days (sports-related concussions) or within 3 months (non-athletes).  Approximately 20% of people have symptoms after 6 weeks.  With concussions, we have learned that it generally takes youth longer to recover from concussions. Another thing that we now know about concussions is that it usually takes female athletes longer to recover than female athletes.  No two concussions are identical. In fact, there are at least six different clinical trajectories that concussions may take.  Signs and Symptoms of Concussion:  The signs and symptoms of a concussion are incredibly important because a concussion doesn't show up on imaging like an x-ray, CT, or MRI scan and there is no objective test, like a blood or saliva test, that can determine if a patient has a concussion. A doctor makes a concussion diagnosis based on the results of a comprehensive examination, which includes observing signs of concussion and patients reporting symptoms of concussion appearing after an impact to the head or body. Concussion signs and symptoms are the brain's way of showing it is injured and not functioning normally.  CONCUSSION SIGNS  Concussion signs are what someone could observe about you to  determine if you have a concussion.   Common concussion signs include:  Loss of consciousness Problems with balance Glazed look in the eyes Amnesia Delayed response to questions Forgetting an instruction, confusion about an assignment or position, or  confusion of the game, score, or opponent Inappropriate crying Inappropriate laughter Vomiting  CONCUSSION SYMPTOMS  Concussion symptoms are what someone who is concussed will tell you that they are experiencing. Concussion symptoms typically fall into six major categories:  1- Somatic (Physical) Symptoms  Headache Light-headedness Dizziness Nausea Sensitivity to light Sensitivity to noise  2- Cognitive Symptoms  Difficulties with attention Memory problems Loss of focus Difficulty multitasking Difficulty completing mental tasks  3- Sleep Symptoms  Sleeping more than usual Sleeping less than usual Having trouble falling asleep  4- Emotional Symptoms  Anxiety Depression Panic attacks  5 - Vestibular Symptoms  The vestibular system is affected in nearly 60% of youth and adolescent athletes following a concussion.  But what is the vestibular system?  It's the sensory system that helps with your sense of balance and spatial orientation. Think of a gyroscope! With help from your inner ears, your vestibular system detects the motion or position of your head in space. It sends information to your brain that's needed for balance and stable vision.  Need an example? If you're moving and looking at moving objects at the same time (think riding in a car), you're able to stay focused and not lose visual clarity. During a vestibular concussion, your gyroscope isn't working at full potential.  Difficulty with balance Dizziness. It may feel like the room is spinning   or a slow, wavy sensation. (Like you're on a boat!) Trouble stabilizing vision when moving your head. (We call this Vestibular-Ocular Reflux, or VOR.) Think  back to riding in a car. With a vestibular concussion, you can't stay focused. (The technical name for this is Visual Motion Sensitivity, or VMS.) Triggers  These 3 things could bring on vestibular symptoms:  Dynamic movements Busy environments, like the grocery store Crowds  6 - Oculo-motor  A concussion that affects the ocular, or visual, system of the brain. Typically, patients with ocular-motor concussions report pressure headaches in the front of their head, feeling more tired than normal, and becoming more symptomatic doing math or science exercises at school.  Patients also experience difficulties with their eyes working together. Follow along to explore some of the most common symptoms.  Convergence  The eyes converge when viewing objects up close, such as with reading. With convergence problems, patients may see a double image as a target moves closer to them. Typically, without a concussion, objects can be brought very close without doubling.  Accommodation  Accommodation problems cause an object to become blurry as it is viewed up close. Accommodative and convergence problems are often experienced together and can impact reading and other near-vision activities.  Pursuits and Saccades  The eyes use pursuit eye movements to follow objects; while saccade eyes movements allow the eyes to shift rapidly from one object to another. Tracking objects, reading a book, scrolling on a computer or even watching for a moving car while crossing the street can be difficult when patients have problems with pursuit and saccade eye movements.  Misalignment  When people with eye misalignment (one eye drifts, eyes aren't perfectly aligned) sustain a concussion, the brain may have difficulty compensating for the misalignment like it once did. This can result in blurry vision, difficulty taking notes in class and focusing on the chalkboard.  Note: This is not an exhaustive list of concussion  signs and symptoms, and it may take a few days for concussion symptoms to appear after the initial injury.  TREATMENT:  THE MOST IMPORTANT THING IS REST AS SOON AS POSSIBLE AFTER INJURY SO THAT THE BRAIN CAN RECOVER. COMPLETE PHYSICAL AND MENTAL REST ARE NEEDED INITIALLY.   THAT MEANS: NO SCHOOL OR WORK FOR AT LEAST 3 DAYS AND CLEARED BY YOUR DOCTOR NO MENTAL EXERTION, MEANING NO WORK, NO HOMEWORK, NO TEST TAKING.  Avoid situations with loud noise, bright lights, or crowds. However, this doesn't mean isolate yourself in a dark room for a week. Too much isolation and boredom can be harmful, contributing to feelings of anxiety, depression, and resulting in increased recovery time. Spend time with friends and family, but monitor your symptoms and avoid situations that make you feel worse.   NO VIDEO GAMES, NO USING THE COMPUTER, NO TEXTING, NO USING SMARTPHONES, NO USE OF AN IPAD OR TABLET. DO NOT GO TO A MOVIE THEATRE OR WATCH SPORTS ON TV. HDTV TENDS TO MAKE PEOPLE FEEL WORSE.   ON APPROXIMATELY DAY 4, BEGIN VERY LIGHT EXERCISE SUCH AS WALKING. DO NOT START ANY STRENUOUS EXERCISE.   You will be given return to school or return to work recommendations.  
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
## 2022-09-15 ENCOUNTER — Encounter: Payer: Self-pay | Admitting: Family Medicine

## 2022-09-15 ENCOUNTER — Ambulatory Visit (INDEPENDENT_AMBULATORY_CARE_PROVIDER_SITE_OTHER): Payer: 59 | Admitting: Family Medicine

## 2022-09-15 VITALS — BP 110/60 | HR 100 | Temp 98.0°F | Ht 60.0 in | Wt 136.5 lb

## 2022-09-15 DIAGNOSIS — S060X0D Concussion without loss of consciousness, subsequent encounter: Secondary | ICD-10-CM | POA: Diagnosis not present

## 2022-09-15 DIAGNOSIS — G44309 Post-traumatic headache, unspecified, not intractable: Secondary | ICD-10-CM | POA: Diagnosis not present
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
## 2022-09-27 ENCOUNTER — Encounter: Payer: Self-pay | Admitting: Family Medicine

## 2022-09-27 ENCOUNTER — Ambulatory Visit (INDEPENDENT_AMBULATORY_CARE_PROVIDER_SITE_OTHER): Payer: 59 | Admitting: Family Medicine

## 2022-09-27 VITALS — BP 110/70 | HR 78 | Temp 98.2°F | Ht 60.0 in | Wt 133.1 lb

## 2022-09-27 DIAGNOSIS — S060X0D Concussion without loss of consciousness, subsequent encounter: Secondary | ICD-10-CM

## 2022-09-27 DIAGNOSIS — G44309 Post-traumatic headache, unspecified, not intractable: Secondary | ICD-10-CM | POA: Diagnosis not present

## 2022-09-29 ENCOUNTER — Ambulatory Visit: Payer: 59 | Admitting: Primary Care

## 2022-10-13 ENCOUNTER — Other Ambulatory Visit: Payer: Self-pay | Admitting: Primary Care

## 2022-10-13 DIAGNOSIS — N926 Irregular menstruation, unspecified: Secondary | ICD-10-CM

## 2022-10-14 ENCOUNTER — Other Ambulatory Visit (HOSPITAL_COMMUNITY): Payer: Self-pay

## 2022-10-14 MED ORDER — NORGESTIMATE-ETH ESTRADIOL 0.25-35 MG-MCG PO TABS
1.0000 | ORAL_TABLET | Freq: Every day | ORAL | 0 refills | Status: DC
Start: 2022-10-14 — End: 2023-01-26
  Filled 2022-10-14: qty 84, 84d supply, fill #0

## 2022-10-21 ENCOUNTER — Encounter: Payer: Self-pay | Admitting: *Deleted

## 2022-10-21 ENCOUNTER — Ambulatory Visit (INDEPENDENT_AMBULATORY_CARE_PROVIDER_SITE_OTHER): Payer: 59 | Admitting: Primary Care

## 2022-10-21 ENCOUNTER — Encounter: Payer: Self-pay | Admitting: Primary Care

## 2022-10-21 VITALS — BP 100/78 | HR 97 | Temp 98.1°F | Ht 60.0 in | Wt 132.0 lb

## 2022-10-21 DIAGNOSIS — N926 Irregular menstruation, unspecified: Secondary | ICD-10-CM | POA: Diagnosis not present

## 2022-10-21 NOTE — Assessment & Plan Note (Addendum)
Uncontrolled.  Given that she has failed to hormonal treatment options for bleeding, we will refer her to GYN for further evaluation.  She agrees. Consider pelvic/transvaginal ultrasound if she cannot get in with GYN within the next month.  She will update.  Referral placed.  Continue Sprintec 0.25-35 mg-mcg daily.
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
## 2022-10-21 NOTE — Patient Instructions (Signed)
You will either be contacted via phone regarding your referral to GYN, or you may receive a letter on your MyChart portal from our referral team with instructions for scheduling an appointment. Please let us know if you have not been contacted by anyone within two weeks.  Continue taking the birth control pills.  It was a pleasure to see you today!

## 2022-10-22 DIAGNOSIS — N926 Irregular menstruation, unspecified: Secondary | ICD-10-CM

## 2022-10-29 ENCOUNTER — Ambulatory Visit
Admission: RE | Admit: 2022-10-29 | Discharge: 2022-10-29 | Disposition: A | Discharge status: Home / Self Care | Payer: 59 | Source: Ambulatory Visit | Attending: Primary Care | Admitting: Primary Care

## 2022-10-29 DIAGNOSIS — N926 Irregular menstruation, unspecified: Secondary | ICD-10-CM

## 2022-10-29 DIAGNOSIS — N938 Other specified abnormal uterine and vaginal bleeding: Secondary | ICD-10-CM | POA: Diagnosis not present

## 2022-12-08 ENCOUNTER — Encounter: Payer: Self-pay | Admitting: Family Medicine

## 2022-12-08 ENCOUNTER — Other Ambulatory Visit (HOSPITAL_COMMUNITY): Payer: Self-pay

## 2022-12-08 ENCOUNTER — Ambulatory Visit: Payer: 59 | Admitting: Family Medicine

## 2022-12-08 VITALS — BP 118/79 | HR 90 | Ht 60.0 in | Wt 135.8 lb

## 2022-12-08 DIAGNOSIS — Z3041 Encounter for surveillance of contraceptive pills: Secondary | ICD-10-CM | POA: Diagnosis not present

## 2022-12-08 MED ORDER — NORGESTIM-ETH ESTRAD TRIPHASIC 0.18/0.215/0.25 MG-25 MCG PO TABS
1.0000 | ORAL_TABLET | Freq: Every day | ORAL | 1 refills | Status: DC
Start: 2022-12-08 — End: 2023-01-26
  Filled 2022-12-08 – 2022-12-10 (×2): qty 28, 28d supply, fill #0
  Filled 2023-01-04: qty 28, 28d supply, fill #1

## 2022-12-08 NOTE — Progress Notes (Signed)
New Gyn US pelvis 10/29/22  Has tried 2 different types of pill  Started Jan  Acne with periods    Abnormal Menstrual periods  Pt has app on her phone that shows her periods, pretty much has period every other week or every other 2 weeks

## 2022-12-08 NOTE — Progress Notes (Signed)
    Subjective:    Patient ID: Molly Bryant is a 22 y.o. female presenting with GYN   on 12/08/2022  HPI: Menarche at age 32. Were monthly and lasted 4 days. Started COC's for acne and contraception and has been on 2 different pills and has been having breakthrough bleeding with each of them. On Loestrin to start and changed ot Sprintec for last 4 months. Still having spotting since being on these.   Review of Systems  Constitutional:  Negative for chills and fever.  Respiratory:  Negative for shortness of breath.   Cardiovascular:  Negative for chest pain.  Gastrointestinal:  Negative for abdominal pain, nausea and vomiting.  Genitourinary:  Negative for dysuria.  Skin:  Negative for rash.      Objective:    BP 118/79   Pulse 90   Ht 5' (1.524 m)   Wt 135 lb 12.8 oz (61.6 kg)   BMI 26.52 kg/m  Physical Exam Exam conducted with a chaperone present.  Constitutional:      General: She is not in acute distress.    Appearance: She is well-developed.  HENT:     Head: Normocephalic and atraumatic.  Eyes:     General: No scleral icterus. Cardiovascular:     Rate and Rhythm: Normal rate.  Pulmonary:     Effort: Pulmonary effort is normal.  Abdominal:     Palpations: Abdomen is soft.  Musculoskeletal:     Cervical back: Neck supple.  Skin:    General: Skin is warm and dry.  Neurological:     Mental Status: She is alert and oriented to person, place, and time.         Assessment & Plan:  Encounter for surveillance of contraceptive pills - tiral of Triphasic, if not helpful consider Paragard as alternative, though will not help with acne. - Plan: Norgestimate-Ethinyl Estradiol Triphasic 0.18/0.215/0.25 MG-25 MCG tab  Return in about 7 weeks (around 01/26/2023).  Reva Bores, MD 12/08/2022 3:22 PM

## 2022-12-10 ENCOUNTER — Other Ambulatory Visit (HOSPITAL_BASED_OUTPATIENT_CLINIC_OR_DEPARTMENT_OTHER): Payer: Self-pay

## 2022-12-10 ENCOUNTER — Other Ambulatory Visit (HOSPITAL_COMMUNITY): Payer: Self-pay

## 2022-12-10 ENCOUNTER — Other Ambulatory Visit: Payer: Self-pay

## 2023-01-04 ENCOUNTER — Other Ambulatory Visit (HOSPITAL_COMMUNITY): Payer: Self-pay

## 2023-01-07 ENCOUNTER — Other Ambulatory Visit (HOSPITAL_COMMUNITY): Payer: Self-pay

## 2023-01-26 ENCOUNTER — Encounter: Payer: Self-pay | Admitting: Family Medicine

## 2023-01-26 ENCOUNTER — Ambulatory Visit (INDEPENDENT_AMBULATORY_CARE_PROVIDER_SITE_OTHER): Payer: 59 | Admitting: Family Medicine

## 2023-01-26 ENCOUNTER — Other Ambulatory Visit (HOSPITAL_COMMUNITY): Payer: Self-pay

## 2023-01-26 DIAGNOSIS — Z3041 Encounter for surveillance of contraceptive pills: Secondary | ICD-10-CM

## 2023-01-26 MED ORDER — NORGESTIM-ETH ESTRAD TRIPHASIC 0.18/0.215/0.25 MG-25 MCG PO TABS
1.0000 | ORAL_TABLET | Freq: Every day | ORAL | 3 refills | Status: DC
Start: 2023-01-26 — End: 2023-08-27
  Filled 2023-01-26: qty 84, 84d supply, fill #0
  Filled 2023-04-14: qty 84, 84d supply, fill #1
  Filled 2023-07-06: qty 84, 84d supply, fill #2

## 2023-01-26 NOTE — Progress Notes (Signed)
Follow up : OCP  Doing well  No concerns  Suppose to start period tomorrow

## 2023-01-26 NOTE — Progress Notes (Signed)
    Subjective:    Patient ID: Molly Bryant is a 22 y.o. female presenting with Follow-up  on 01/26/2023  HPI: Patient has been on a triphasic pill and has done 2 months. Some BTB 1st month. Better this time.  Review of Systems  Constitutional:  Negative for chills and fever.  Respiratory:  Negative for shortness of breath.   Cardiovascular:  Negative for chest pain.  Gastrointestinal:  Negative for abdominal pain, nausea and vomiting.  Genitourinary:  Negative for dysuria.  Skin:  Negative for rash.      Objective:    BP 132/76   Pulse 86   Wt 139 lb (63 kg)   BMI 27.15 kg/m  Physical Exam Exam conducted with a chaperone present.  Constitutional:      General: She is not in acute distress.    Appearance: She is well-developed.  HENT:     Head: Normocephalic and atraumatic.  Eyes:     General: No scleral icterus. Cardiovascular:     Rate and Rhythm: Normal rate.  Pulmonary:     Effort: Pulmonary effort is normal.  Abdominal:     Palpations: Abdomen is soft.  Musculoskeletal:     Cervical back: Neck supple.  Skin:    General: Skin is warm and dry.  Neurological:     Mental Status: She is alert and oriented to person, place, and time.         Assessment & Plan:  Encounter for surveillance of contraceptive pills - continue triphasic pill. - Plan: Norgestim-Eth Estrad Triphasic (NORGESTIMATE-ETHINYL ESTRADIOL TRIPHASIC) 0.18/0.215/0.25 MG-25 MCG tab  Return in about 1 year (around 01/26/2024).  Reva Bores, MD 01/26/2023 10:36 AM

## 2023-03-17 ENCOUNTER — Encounter: Payer: Self-pay | Admitting: Internal Medicine

## 2023-03-17 ENCOUNTER — Ambulatory Visit: Payer: Self-pay | Admitting: Primary Care

## 2023-03-17 ENCOUNTER — Other Ambulatory Visit (HOSPITAL_COMMUNITY): Payer: Self-pay

## 2023-03-17 ENCOUNTER — Ambulatory Visit: Payer: Commercial Managed Care - PPO | Admitting: Internal Medicine

## 2023-03-17 VITALS — BP 105/75 | HR 90 | Temp 98.7°F | Resp 18 | Wt 137.6 lb

## 2023-03-17 DIAGNOSIS — J101 Influenza due to other identified influenza virus with other respiratory manifestations: Secondary | ICD-10-CM

## 2023-03-17 LAB — POCT INFLUENZA A/B
Influenza A, POC: POSITIVE — AB
Influenza B, POC: NEGATIVE

## 2023-03-17 LAB — POCT RAPID STREP A (OFFICE): Rapid Strep A Screen: NEGATIVE

## 2023-03-17 LAB — POC COVID19 BINAXNOW: SARS Coronavirus 2 Ag: NEGATIVE

## 2023-03-17 MED ORDER — OSELTAMIVIR PHOSPHATE 75 MG PO CAPS
75.0000 mg | ORAL_CAPSULE | Freq: Two times a day (BID) | ORAL | 0 refills | Status: DC
Start: 2023-03-17 — End: 2023-08-27
  Filled 2023-03-17: qty 10, 5d supply, fill #0

## 2023-03-17 NOTE — Telephone Encounter (Signed)
 Noted and appreciate Molly Bryant's evaluation.
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
## 2023-03-17 NOTE — Patient Instructions (Signed)
 Rest, drink plenty of fluids.  Tylenol  for fever, body aches.   For sore throat:  Warm salt water gargles  Throat lozenges  Chloraseptic spray   For cough: Take Mucinex  DM or Robitussin-DM over-the-counter.  Follow the instructions in the box.  For nasal and sinus congestion: Use over-the-counter Flonase: 2 nasal sprays on each side of the nose in the morning until you feel better  Use over-the-counter Astepro 2 nasal sprays on each side of the nose twice daily until better  Patients without high blood pressure: can use decongestants such as pseudoephedrine or phenylephrine  no more than 3-5 days.   Patients with high blood pressure can use Coricidin HBP for decongestant, it will not raise your blood pressure.    Call if not gradually better over the next  10 days  Call anytime if the symptoms are severe, you have high fever, short of breath, chest pain   Must be fever free for 24 hours without medication AND symptoms improving before returning to work.

## 2023-03-17 NOTE — Telephone Encounter (Signed)
   Chief Complaint: face swelling Symptoms: fever, sore throat Frequency: since last night   Disposition: [] ED /[] Urgent Care (no appt availability in office) / [x] Appointment(In office/virtual)/ []  [facility]/ [] Home Care/ [] Refused Recommended Disposition /[] [facility]/ []  Follow-up with PCP Additional Notes: Pt complaining of  facial swelling, sore throat, and fever. Pt stated the symptoms started last night around 1700. Pt stated she took tylenol  and Allegro. Face was swollen to the point vision was decreased. Pt states she can see normal this morning.  No fever since 1900 last night, but face is still swollen. Pt stated she had this several months ago, but the Allegro helped and swelling went away. Pt said there has been no injury, not allergic to any food, denies any dental pain. Pt is unsure why this is happening. Pt currently denies SOB ,but stated she had it some yesterday. Per protocol, pt to be seen today. No provider appts at [facility], but pt willing to [facility] for appt at 1020. RN gave care advice and pt verbalized understanding.           Copied from CRM [id number]. Topic: Clinical - Red Word Triage >> Mar 17, 2023  8:40 AM Corean SAUNDERS wrote: Red Word that prompted transfer to Nurse Triage: Swollen face, sore throat, fever last night Reason for Disposition  Face swelling is painful to touch  Answer Assessment - Initial Assessment Questions 1. ONSET: When did the swelling start? (e.g., minutes, hours, days)     Last night at 1900 2. LOCATION: What part of the face is swollen?     Whole face 3. SEVERITY: How swollen is it?     At times barely open eyes 4. ITCHING: Is there any itching? If Yes, ask: How much?   (Scale 1-10; mild, moderate or severe)     no 5. PAIN: Is the swelling painful to touch? If Yes, ask: How painful is it?   (Scale 1-10; mild, moderate or severe)   - NONE (0): no pain   - MILD (1-3): doesn't interfere with  normal activities    - MODERATE (4-7): interferes with normal activities or awakens from sleep    - SEVERE (8-10): excruciating pain, unable to do any normal activities      Mild- around eyelids  6. FEVER: Do you have a fever? If Yes, ask: What is it, how was it measured, and when did it start?      Last night at 1900 7. CAUSE: What do you think is causing the face swelling?     Unsure  8. RECURRENT SYMPTOM: Have you had face swelling before? If Yes, ask: When was the last time? What happened that time?     Yes- few months ago 9. OTHER SYMPTOMS: Do you have any other symptoms? (e.g., toothache, leg swelling)     Sore throat 10. PREGNANCY: Is there any chance you are pregnant? When was your last menstrual period?       no  Protocols used: Face Swelling-A-AH

## 2023-08-16 ENCOUNTER — Encounter: Payer: Self-pay | Admitting: Primary Care

## 2023-08-16 ENCOUNTER — Ambulatory Visit: Admitting: Primary Care

## 2023-08-16 ENCOUNTER — Ambulatory Visit: Admission: RE | Admit: 2023-08-16 | Source: Ambulatory Visit

## 2023-08-16 ENCOUNTER — Ambulatory Visit
Admission: RE | Admit: 2023-08-16 | Discharge: 2023-08-16 | Disposition: A | Discharge status: Home / Self Care | Source: Ambulatory Visit | Attending: Primary Care | Admitting: Primary Care

## 2023-08-16 VITALS — BP 132/80 | HR 124 | Temp 98.2°F | Ht 60.0 in | Wt 139.0 lb

## 2023-08-16 DIAGNOSIS — Z3201 Encounter for pregnancy test, result positive: Secondary | ICD-10-CM | POA: Diagnosis not present

## 2023-08-16 DIAGNOSIS — N939 Abnormal uterine and vaginal bleeding, unspecified: Secondary | ICD-10-CM | POA: Insufficient documentation

## 2023-08-16 DIAGNOSIS — Z3A00 Weeks of gestation of pregnancy not specified: Secondary | ICD-10-CM | POA: Diagnosis not present

## 2023-08-16 DIAGNOSIS — O3680X0 Pregnancy with inconclusive fetal viability, not applicable or unspecified: Secondary | ICD-10-CM | POA: Diagnosis not present

## 2023-08-16 NOTE — Patient Instructions (Signed)
 Stop by the lab prior to leaving today. I will notify you of your results once received.   You will receive a phone call regarding the ultrasound.  It was a pleasure to see you today!

## 2023-08-16 NOTE — Addendum Note (Signed)
 Addended by: Masyn Rostro K on: 08/16/2023 04:01 PM   Modules accepted: Orders

## 2023-08-16 NOTE — Assessment & Plan Note (Signed)
 Likely secondary to pregnancy. Need to rule out ectopic pregnancy versus miscarriage.  Stat transvaginal/pelvic ultrasound ordered pending.  She is stable for outpatient treatment at this time.
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
## 2023-08-16 NOTE — Assessment & Plan Note (Addendum)
 hCG quantitative lab ordered and pending. Stop OCPs for now.

## 2023-08-16 NOTE — Addendum Note (Signed)
 Addended by: NARCISO ANDREZ BROCKS on: 08/16/2023 03:38 PM   Modules accepted: Orders

## 2023-08-17 ENCOUNTER — Other Ambulatory Visit: Payer: Self-pay | Admitting: Primary Care

## 2023-08-17 ENCOUNTER — Ambulatory Visit: Payer: Self-pay | Admitting: Primary Care

## 2023-08-17 DIAGNOSIS — Z3201 Encounter for pregnancy test, result positive: Secondary | ICD-10-CM

## 2023-08-17 LAB — HCG, QUANTITATIVE, PREGNANCY: Quantitative HCG: 126.04 m[IU]/mL

## 2023-08-19 ENCOUNTER — Ambulatory Visit: Payer: Self-pay | Admitting: Primary Care

## 2023-08-19 ENCOUNTER — Other Ambulatory Visit (INDEPENDENT_AMBULATORY_CARE_PROVIDER_SITE_OTHER)

## 2023-08-19 ENCOUNTER — Encounter: Payer: Self-pay | Admitting: Family Medicine

## 2023-08-19 DIAGNOSIS — Z3201 Encounter for pregnancy test, result positive: Secondary | ICD-10-CM | POA: Diagnosis not present

## 2023-08-19 LAB — HCG, QUANTITATIVE, PREGNANCY: Quantitative HCG: 513.65 m[IU]/mL

## 2023-08-27 ENCOUNTER — Inpatient Hospital Stay (HOSPITAL_COMMUNITY)

## 2023-08-27 ENCOUNTER — Encounter (HOSPITAL_COMMUNITY): Payer: Self-pay

## 2023-08-27 ENCOUNTER — Other Ambulatory Visit (HOSPITAL_COMMUNITY): Payer: Self-pay

## 2023-08-27 ENCOUNTER — Inpatient Hospital Stay (HOSPITAL_COMMUNITY)
Admission: AD | Admit: 2023-08-27 | Discharge: 2023-08-27 | Disposition: A | Discharge status: Home / Self Care | Attending: Obstetrics & Gynecology | Admitting: Obstetrics & Gynecology

## 2023-08-27 DIAGNOSIS — R109 Unspecified abdominal pain: Secondary | ICD-10-CM | POA: Insufficient documentation

## 2023-08-27 DIAGNOSIS — O3680X0 Pregnancy with inconclusive fetal viability, not applicable or unspecified: Secondary | ICD-10-CM | POA: Diagnosis not present

## 2023-08-27 DIAGNOSIS — Z3A01 Less than 8 weeks gestation of pregnancy: Secondary | ICD-10-CM

## 2023-08-27 DIAGNOSIS — O26891 Other specified pregnancy related conditions, first trimester: Secondary | ICD-10-CM | POA: Insufficient documentation

## 2023-08-27 DIAGNOSIS — O208 Other hemorrhage in early pregnancy: Secondary | ICD-10-CM | POA: Insufficient documentation

## 2023-08-27 DIAGNOSIS — O3481 Maternal care for other abnormalities of pelvic organs, first trimester: Secondary | ICD-10-CM | POA: Diagnosis not present

## 2023-08-27 DIAGNOSIS — O26899 Other specified pregnancy related conditions, unspecified trimester: Secondary | ICD-10-CM

## 2023-08-27 DIAGNOSIS — O418X10 Other specified disorders of amniotic fluid and membranes, first trimester, not applicable or unspecified: Secondary | ICD-10-CM

## 2023-08-27 LAB — URINALYSIS, ROUTINE W REFLEX MICROSCOPIC
Bilirubin Urine: NEGATIVE
Glucose, UA: NEGATIVE mg/dL
Hgb urine dipstick: NEGATIVE
Ketones, ur: NEGATIVE mg/dL
Leukocytes,Ua: NEGATIVE
Nitrite: NEGATIVE
Protein, ur: NEGATIVE mg/dL
Specific Gravity, Urine: 1.009 (ref 1.005–1.030)
pH: 8 (ref 5.0–8.0)

## 2023-08-27 LAB — CBC
HCT: 38.2 % (ref 36.0–46.0)
Hemoglobin: 12.9 g/dL (ref 12.0–15.0)
MCH: 29.6 pg (ref 26.0–34.0)
MCHC: 33.8 g/dL (ref 30.0–36.0)
MCV: 87.6 fL (ref 80.0–100.0)
Platelets: 256 K/uL (ref 150–400)
RBC: 4.36 MIL/uL (ref 3.87–5.11)
RDW: 12.7 % (ref 11.5–15.5)
WBC: 7.2 K/uL (ref 4.0–10.5)
nRBC: 0 % (ref 0.0–0.2)

## 2023-08-27 LAB — WET PREP, GENITAL
Clue Cells Wet Prep HPF POC: NONE SEEN
Sperm: NONE SEEN
Trich, Wet Prep: NONE SEEN
WBC, Wet Prep HPF POC: >=10 — AB (ref <10)
Yeast Wet Prep HPF POC: NONE SEEN

## 2023-08-27 LAB — HCG, QUANTITATIVE, PREGNANCY: hCG, Beta Chain, Quant, S: 16557 m[IU]/mL — ABNORMAL HIGH (ref <5)

## 2023-08-27 MED ORDER — CYCLOBENZAPRINE HCL 5 MG PO TABS
10.0000 mg | ORAL_TABLET | Freq: Once | ORAL | Status: AC
  Administered 2023-08-27: 10 mg via ORAL
  Filled 2023-08-27: qty 2

## 2023-08-27 MED ORDER — ACETAMINOPHEN 500 MG PO TABS
1000.0000 mg | ORAL_TABLET | Freq: Once | ORAL | Status: AC
  Administered 2023-08-27: 1000 mg via ORAL
  Filled 2023-08-27: qty 2

## 2023-08-27 MED ORDER — CYCLOBENZAPRINE HCL 10 MG PO TABS
10.0000 mg | ORAL_TABLET | Freq: Two times a day (BID) | ORAL | 0 refills | Status: DC | PRN
  Filled 2023-08-27: qty 20, 10d supply, fill #0

## 2023-08-27 NOTE — MAU Note (Signed)
..  Molly Bryant is a 23 y.o. at [redacted]w[redacted]d here in MAU reporting: lower right sided abdominal pain that began yesterday. She states it is sharp in nature and it comes and goes. She had serial HCGs that rose appropriately on 7/8 (126.04) and 7/11 (513.65). She had an ultrasound done on 7/8 as well and was told to follow up in the office due to pregnancy of unknown location. Repeat ultrasound is scheduled for 8/6. She denies recent intercourse or vaginal bleeding. Does report a thick/white discharge that began when she found out she was pregnant.  LMP: 07/12/23 Onset of complaint: yesterday Pain score: 5 Vitals:   08/27/23 1015  BP: 120/72  Pulse: 96  Resp: 14  Temp: 98.6 F (37 C)  SpO2: 100%     FHT:n/a Lab orders placed from triage:   UA, Wet prep, GC

## 2023-08-27 NOTE — MAU Provider Note (Signed)
 S Molly Bryant is a 23 y.o. G1P0000 patient who presents to MAU today with complaint of that she is  approximately 6 weeks 4 days pregnant reporting lower right-sided abdominal pain that began yesterday.  She has had serial hCGs that rose appropriately from 7/8-7/11.  She had an ultrasound done on 7/8  which was inconclusive for an IUP.  Repeat ultrasound was scheduled for 09/14/2023.  She denies any vaginal bleeding leaking of fluid or urinary/vaginal complaints at this time at this time.     O BP 111/63 (BP Location: Right Arm)   Pulse 79   Temp 98.6 F (37 C) (Oral)   Resp 16   Wt 63.2 kg   LMP 07/12/2023   SpO2 100%   BMI 27.22 kg/m  Physical Exam Vitals and nursing note reviewed.  Constitutional:      General: She is not in acute distress.    Appearance: She is well-developed. She is not ill-appearing.  HENT:     Head: Normocephalic.     Nose: Nose normal.     Mouth/Throat:     Mouth: Mucous membranes are moist.  Cardiovascular:     Rate and Rhythm: Normal rate.  Pulmonary:     Effort: Pulmonary effort is normal.  Abdominal:     Palpations: Abdomen is soft.  Musculoskeletal:     Cervical back: Normal range of motion.  Skin:    General: Skin is warm.  Neurological:     Mental Status: She is alert and oriented to person, place, and time.  Psychiatric:        Mood and Affect: Mood normal.        Behavior: Behavior normal.     MDM  HIGH  Vaginal bleeding/ or abdominal pain in early pregnacy CBC: unremarkable HCG Quant: 16,557  OB Ultrasound ( IUP with GS, YS, and embryo although no Cardiac activity seen) A large  Subchorionic hemorrhage is present  measuring 18 mm. ( Unable to confirm viability at this time)  Swabs: Neg, GC pending UA: Neg   Differential diagnosis considered for 1st trimester vaginal bleeding includes but is not limited to: ectopic pregnancy, complete spontaneous abortion, incomplete abortion, missed abortion, threatened  abortion, embryonic/fetal demise, cervical insufficiency, cervical or vaginal disorder    Orders Placed This Encounter  Procedures   Wet prep, genital    Standing Status:   Standing    Number of Occurrences:   1   US  OB Transvaginal    No T/A US  needed    Standing Status:   Standing    Number of Occurrences:   1    Symptom/Reason for Exam:   Pain [[id number]]   Urinalysis, Routine w reflex microscopic -Urine, Clean Catch    Standing Status:   Standing    Number of Occurrences:   1    Specimen Source:   Urine, Clean Catch [76]   CBC    Standing Status:   Standing    Number of Occurrences:   1   hCG, quantitative, pregnancy    Standing Status:   Standing    Number of Occurrences:   1   Discharge patient Discharge disposition: 01-Home or Self Care; Discharge patient date: 08/27/2023    Standing Status:   Standing    Number of Occurrences:   1    Discharge disposition:   01-Home or Self Care [1]    Discharge patient date:   08/27/2023        Narrative & Impression  CLINICAL DATA:  Six weeks pregnant.  Pain   EXAM: OBSTETRIC <14 WK US  AND TRANSVAGINAL OB US    TECHNIQUE: Both transabdominal and transvaginal ultrasound examinations were performed for complete evaluation of the gestation as well as the maternal uterus, adnexal regions, and pelvic cul-de-sac. Transvaginal technique was performed to assess early pregnancy.   COMPARISON:  08/16/2023   FINDINGS: Intrauterine gestational sac: Single   Yolk sac:  Visualized.   Embryo:  Visualized.   Cardiac Activity: No   MSD: 12.1 mm   6 w   0 d   CRL:  3.2 mm   5 w   6 d                  US  EDC: 04/22/2024   Subchorionic hemorrhage:  Yes measuring 18 mm.   Maternal uterus/adnexae: Right ovary measures 3.9 x 1.7 by 2.4 cm. Small follicles are seen. Left ovary measures 2.8 by 4.1 x 3.0 cm and is a complex cystic area measuring up to 2.2 cm. There is some nodular and thickened septa along the structure. This is new  from the prior exam.   IMPRESSION: Intrauterine pregnancy identified of approximately 5 weeks and 6 days by crown-rump length. However no fetal heart motion detected at this time. Recommend very close follow-up with serial beta HCG and ultrasound to assess for viability.   Heterogeneous material adjacent to the gestational sac could be a small subchronic hemorrhage.   New complex cystic lesion left ovary with some thick nodular septa. With the time course favor this being a hemorrhagic cystic lesion. Simple attention on follow-up.     Electronically Signed   By: Ranell Bring M.D.   On: 08/27/2023 11:34     I have reviewed the patient chart and performed the physical exam . I have ordered & interpreted the lab results and reviewed and interpreted the ultrasound images and agree with the radiologist findings  A/P as described below.  Counseling and education provided and patient agreeable  with plan as described below. Verbalized understanding.    ASSESSMENT Medical screening exam complete  1. Pregnancy with uncertain fetal viability, single or unspecified fetus (Primary)  2. Abdominal cramping affecting pregnancy  3. Subchorionic hematoma in first trimester, single or unspecified fetus     PLAN Future Appointments  Date Time Provider Department Center  09/14/2023  9:15 AM [facility]  09/22/2023  2:50 PM Fredirick Glenys RAMAN, MD [facility]    Discharge from MAU in stable condition  Keep appointments as scheduled above  See AVS for full description of educational information and instructions provided to the patient at time of discharge   Warning signs for worsening condition that would warrant emergency follow-up discussed Patient may return to MAU as needed   Littie Olam LABOR, NP 08/27/2023 12:37 PM

## 2023-08-29 ENCOUNTER — Other Ambulatory Visit (HOSPITAL_COMMUNITY): Payer: Self-pay

## 2023-08-29 LAB — GC/CHLAMYDIA PROBE AMP (~~LOC~~) NOT AT ARMC
Chlamydia: NEGATIVE
Comment: NEGATIVE
Comment: NORMAL
Neisseria Gonorrhea: NEGATIVE

## 2023-09-07 ENCOUNTER — Other Ambulatory Visit (HOSPITAL_COMMUNITY): Payer: Self-pay

## 2023-09-09 ENCOUNTER — Encounter: Payer: Self-pay | Admitting: Family Medicine

## 2023-09-09 ENCOUNTER — Ambulatory Visit: Payer: Self-pay

## 2023-09-09 NOTE — Telephone Encounter (Signed)
 Noted.  Follow-up with OB/GYN as scheduled.

## 2023-09-09 NOTE — Telephone Encounter (Signed)
 FYI Only or Action Required?: FYI only for provider.  Patient was last seen in primary care on 08/16/2023 by Gretta Comer POUR, NP.  Called Nurse Triage reporting Vaginal Bleeding.  Symptoms began today.  Interventions attempted: Nothing.  Symptoms are: stable.  Triage Disposition: Home Care  Patient/caregiver understands and will follow disposition?: Yes    Copied from CRM [id number]. Topic: Clinical - Red Word Triage >> Sep 09, 2023 11:57 AM Suzen RAMAN wrote: Red Word that prompted transfer to Nurse Triage: [redacted] weeks pregnant,spotting(brown) Reason for Disposition  SPOTTING (single or brief episode)  Answer Assessment - Initial Assessment Questions 1. ONSET: When did this bleeding start?       This am 2. BLEEDING SEVERITY: Describe the bleeding that you are having. How much bleeding is there?      States spotting and brownish with fibers 3. ABDOMEN PAIN: Do you have any pain? How bad is the pain?  (e.g., Scale 0-10; none, mild, moderate, or severe)     denies 4. PREGNANCY: Do you know how many weeks or months pregnant you are? When was the first day of your last normal menstrual period?     8 weeks, states first apt on 09/14/23 5. ULTRASOUND: Have you had an ultrasound during this pregnancy?  Note: To confirm intrauterine pregnancy, placenta location.     A week or two ago 6. HEMODYNAMIC STATUS: Are you weak or feeling lightheaded? If Yes, ask: Can you stand and walk normally?      States feels fine 7. OTHER SYMPTOMS: What other symptoms are you having with the bleeding? (e.g., passed tissue, vaginal discharge, fever, menstrual-type cramps)     Denies.  Protocols used: Pregnancy - Vaginal Bleeding Less Than [redacted] Weeks EGA-A-AH

## 2023-09-13 ENCOUNTER — Encounter

## 2023-09-14 ENCOUNTER — Ambulatory Visit: Admitting: *Deleted

## 2023-09-14 ENCOUNTER — Other Ambulatory Visit (INDEPENDENT_AMBULATORY_CARE_PROVIDER_SITE_OTHER): Payer: Self-pay

## 2023-09-14 ENCOUNTER — Other Ambulatory Visit (HOSPITAL_COMMUNITY): Payer: Self-pay

## 2023-09-14 VITALS — BP 115/71 | HR 86 | Wt 140.0 lb

## 2023-09-14 DIAGNOSIS — Z3A01 Less than 8 weeks gestation of pregnancy: Secondary | ICD-10-CM

## 2023-09-14 DIAGNOSIS — O3680X0 Pregnancy with inconclusive fetal viability, not applicable or unspecified: Secondary | ICD-10-CM

## 2023-09-14 DIAGNOSIS — Z3401 Encounter for supervision of normal first pregnancy, first trimester: Secondary | ICD-10-CM

## 2023-09-14 DIAGNOSIS — Z3400 Encounter for supervision of normal first pregnancy, unspecified trimester: Secondary | ICD-10-CM | POA: Diagnosis not present

## 2023-09-14 MED ORDER — PROMETHAZINE HCL 25 MG PO TABS
25.0000 mg | ORAL_TABLET | Freq: Four times a day (QID) | ORAL | 2 refills | Status: DC | PRN
  Filled 2023-09-14 – 2023-09-16 (×2): qty 30, 8d supply, fill #0

## 2023-09-14 NOTE — Progress Notes (Signed)
 New OB Intake  I explained I am completing New OB Intake today. We discussed EDD of 04/28/2024, by Ultrasound. Pt is G1P0000. I reviewed her allergies, medications and Medical/Surgical/OB history.    Patient Active Problem List   Diagnosis Date Noted   Encounter for supervision of normal first pregnancy in first trimester 09/14/2023   History of seizure 02/26/2022    Concerns addressed today  Patient informed that the ultrasound is considered a limited obstetric ultrasound and is not intended to be a complete ultrasound exam.  Patient also informed that the ultrasound is not being completed with the intent of assessing for fetal or placental anomalies or any pelvic abnormalities. Explained that the purpose of today's ultrasound is to assess for viability.  Patient acknowledges the purpose of the exam and the limitations of the study.     Delivery Plans Plans to deliver at [facility]. Discussed the nature of our practice with multiple providers including residents and students. Due to the size of the practice, the delivering provider may not be the same as those providing prenatal care.   MyChart/Babyscripts MyChart access verified. I explained pt will have some visits in office and some virtually. Babyscripts app discussed and ordered.   Blood Pressure Cuff Blood pressure cuff discussed and given.Discussed to be used for virtual visits and or if needed BP checks weekly.  Anatomy US  Explained first scheduled US  will be around 19 weeks.   Last Pap NA  First visit review I reviewed new OB appt with patient. Explained pt will be seen by Dr Herchel at first visit. Discussed [place] genetic screening with patient will decide at [facility]. Routine prenatal labs collected today.SABRA Wanda Buckles, RN 09/14/2023  10:50 AM

## 2023-09-15 LAB — CBC/D/PLT+RPR+RH+ABO+RUBIGG...
Antibody Screen: NEGATIVE
Basophils Absolute: 0 x10E3/uL (ref 0.0–0.2)
Basos: 0 %
EOS (ABSOLUTE): 0 x10E3/uL (ref 0.0–0.4)
Eos: 0 %
HCV Ab: NONREACTIVE
HIV Screen 4th Generation wRfx: NONREACTIVE
Hematocrit: 42.9 % (ref 34.0–46.6)
Hemoglobin: 14 g/dL (ref 11.1–15.9)
Hepatitis B Surface Ag: NEGATIVE
Immature Grans (Abs): 0 x10E3/uL (ref 0.0–0.1)
Immature Granulocytes: 0 %
Lymphocytes Absolute: 1.7 x10E3/uL (ref 0.7–3.1)
Lymphs: 17 %
MCH: 29.9 pg (ref 26.6–33.0)
MCHC: 32.6 g/dL (ref 31.5–35.7)
MCV: 92 fL (ref 79–97)
Monocytes Absolute: 0.5 x10E3/uL (ref 0.1–0.9)
Monocytes: 6 %
Neutrophils Absolute: 7.5 x10E3/uL — ABNORMAL HIGH (ref 1.4–7.0)
Neutrophils: 77 %
Platelets: 243 x10E3/uL (ref 150–450)
RBC: 4.68 x10E6/uL (ref 3.77–5.28)
RDW: 14 % (ref 11.7–15.4)
RPR Ser Ql: NONREACTIVE
Rh Factor: POSITIVE
Rubella Antibodies, IGG: 3.08 {index} (ref >0.99)
WBC: 9.8 x10E3/uL (ref 3.4–10.8)

## 2023-09-15 LAB — HCV INTERPRETATION

## 2023-09-16 ENCOUNTER — Other Ambulatory Visit (HOSPITAL_BASED_OUTPATIENT_CLINIC_OR_DEPARTMENT_OTHER): Payer: Self-pay

## 2023-09-16 ENCOUNTER — Other Ambulatory Visit (HOSPITAL_COMMUNITY): Payer: Self-pay

## 2023-09-16 ENCOUNTER — Ambulatory Visit: Payer: Self-pay | Admitting: Obstetrics and Gynecology

## 2023-09-16 DIAGNOSIS — Z3401 Encounter for supervision of normal first pregnancy, first trimester: Secondary | ICD-10-CM

## 2023-09-16 LAB — CULTURE, OB URINE

## 2023-09-16 LAB — URINE CULTURE, OB REFLEX

## 2023-09-19 ENCOUNTER — Other Ambulatory Visit (HOSPITAL_COMMUNITY): Payer: Self-pay

## 2023-09-21 ENCOUNTER — Encounter: Admitting: Certified Nurse Midwife

## 2023-09-22 ENCOUNTER — Encounter: Admitting: Family Medicine

## 2023-10-06 ENCOUNTER — Ambulatory Visit (INDEPENDENT_AMBULATORY_CARE_PROVIDER_SITE_OTHER): Admitting: Obstetrics & Gynecology

## 2023-10-06 ENCOUNTER — Other Ambulatory Visit (HOSPITAL_COMMUNITY): Payer: Self-pay

## 2023-10-06 ENCOUNTER — Encounter: Payer: Self-pay | Admitting: Obstetrics & Gynecology

## 2023-10-06 VITALS — BP 120/75 | HR 92 | Wt 143.5 lb

## 2023-10-06 DIAGNOSIS — Z3182 Encounter for Rh incompatibility status: Secondary | ICD-10-CM | POA: Diagnosis not present

## 2023-10-06 DIAGNOSIS — Z3A11 11 weeks gestation of pregnancy: Secondary | ICD-10-CM

## 2023-10-06 DIAGNOSIS — Z3401 Encounter for supervision of normal first pregnancy, first trimester: Secondary | ICD-10-CM

## 2023-10-06 MED ORDER — ASPIRIN 81 MG PO TBEC
81.0000 mg | DELAYED_RELEASE_TABLET | Freq: Every day | ORAL | 2 refills | Status: AC
  Filled 2023-10-06: qty 90, 90d supply, fill #0
  Filled 2024-01-11: qty 90, 90d supply, fill #1
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
## 2023-10-06 NOTE — Progress Notes (Deleted)
 Molly Bryant

## 2023-10-07 LAB — COMPREHENSIVE METABOLIC PANEL WITH GFR
ALT: 9 IU/L (ref 0–32)
AST: 17 IU/L (ref 0–40)
Albumin: 4 g/dL (ref 4.0–5.0)
Alkaline Phosphatase: 57 IU/L (ref 42–106)
BUN/Creatinine Ratio: 14 (ref 9–23)
BUN: 7 mg/dL (ref 6–20)
Bilirubin Total: <0.2 mg/dL (ref 0.0–1.2)
CO2: 19 mmol/L — ABNORMAL LOW (ref 20–29)
Calcium: 9.3 mg/dL (ref 8.7–10.2)
Chloride: 100 mmol/L (ref 96–106)
Creatinine, Ser: 0.51 mg/dL — ABNORMAL LOW (ref 0.57–1.00)
Globulin, Total: 2.8 g/dL (ref 1.5–4.5)
Glucose: 75 mg/dL (ref 70–99)
Potassium: 3.8 mmol/L (ref 3.5–5.2)
Sodium: 135 mmol/L (ref 134–144)
Total Protein: 6.8 g/dL (ref 6.0–8.5)
eGFR: 137 mL/min/1.73 (ref >59)

## 2023-10-07 LAB — TSH RFX ON ABNORMAL TO FREE T4: TSH: 0.609 u[IU]/mL (ref 0.450–4.500)

## 2023-10-07 LAB — HEMOGLOBIN A1C
Est. average glucose Bld gHb Est-mCnc: 100 mg/dL
Hgb A1c MFr Bld: 5.1 % (ref 4.8–5.6)

## 2023-10-08 ENCOUNTER — Ambulatory Visit: Payer: Self-pay | Admitting: Obstetrics & Gynecology

## 2023-10-08 DIAGNOSIS — Z3401 Encounter for supervision of normal first pregnancy, first trimester: Secondary | ICD-10-CM

## 2023-10-13 LAB — PANORAMA PRENATAL TEST FULL PANEL:PANORAMA TEST PLUS 5 ADDITIONAL MICRODELETIONS: FETAL FRACTION: 8.4

## 2023-10-17 LAB — HORIZON CUSTOM: REPORT SUMMARY: NEGATIVE

## 2023-10-28 ENCOUNTER — Encounter: Payer: Self-pay | Admitting: *Deleted

## 2023-10-31 ENCOUNTER — Encounter (HOSPITAL_COMMUNITY): Payer: Self-pay | Admitting: Obstetrics and Gynecology

## 2023-10-31 ENCOUNTER — Inpatient Hospital Stay (HOSPITAL_COMMUNITY)
Admission: AD | Admit: 2023-10-31 | Discharge: 2023-11-01 | Disposition: A | Discharge status: Home / Self Care | Attending: Obstetrics and Gynecology | Admitting: Obstetrics and Gynecology

## 2023-10-31 ENCOUNTER — Other Ambulatory Visit: Payer: Self-pay

## 2023-10-31 DIAGNOSIS — O26892 Other specified pregnancy related conditions, second trimester: Secondary | ICD-10-CM | POA: Diagnosis not present

## 2023-10-31 DIAGNOSIS — Z3A15 15 weeks gestation of pregnancy: Secondary | ICD-10-CM | POA: Diagnosis not present

## 2023-10-31 DIAGNOSIS — R519 Headache, unspecified: Secondary | ICD-10-CM | POA: Diagnosis not present

## 2023-10-31 DIAGNOSIS — G43009 Migraine without aura, not intractable, without status migrainosus: Secondary | ICD-10-CM

## 2023-10-31 LAB — URINALYSIS, ROUTINE W REFLEX MICROSCOPIC
Bilirubin Urine: NEGATIVE
Glucose, UA: NEGATIVE mg/dL
Hgb urine dipstick: NEGATIVE
Ketones, ur: NEGATIVE mg/dL
Leukocytes,Ua: NEGATIVE
Nitrite: NEGATIVE
Protein, ur: NEGATIVE mg/dL
Specific Gravity, Urine: 1.01 (ref 1.005–1.030)
pH: 7 (ref 5.0–8.0)

## 2023-10-31 MED ORDER — SODIUM CHLORIDE 0.9 % IV BOLUS
1000.0000 mL | Freq: Once | INTRAVENOUS | Status: AC
  Administered 2023-10-31: 1000 mL via INTRAVENOUS

## 2023-10-31 MED ORDER — METOCLOPRAMIDE HCL 5 MG/ML IJ SOLN
10.0000 mg | Freq: Once | INTRAMUSCULAR | Status: AC
  Administered 2023-10-31: 10 mg via INTRAVENOUS
  Filled 2023-10-31: qty 2

## 2023-10-31 MED ORDER — ACETAMINOPHEN 500 MG PO TABS
1000.0000 mg | ORAL_TABLET | Freq: Once | ORAL | Status: AC
  Administered 2023-10-31: 1000 mg via ORAL
  Filled 2023-10-31: qty 2

## 2023-10-31 NOTE — MAU Note (Signed)
 Molly Bryant is a 23 y.o. at [redacted]w[redacted]d here in MAU reporting: HA all day, feeling dizzy/lightheaded, and having blurry vision. Reports emesis x1 today and had a nosebleed. Denies taking medication for pain. States this has happened before, but she was able to go to sleep and felt better once waking up. Denies abdominal pain, VB, or LOF.   LMP: NA Onset of complaint: all day  Pain score: 8 Vitals:   10/31/23 2108  BP: 124/80  Pulse: (!) 102  Resp: 17  Temp: 98.8 F (37.1 C)  SpO2: 99%     FHT: 154  Lab orders placed from triage: UA
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
## 2023-11-01 DIAGNOSIS — O26892 Other specified pregnancy related conditions, second trimester: Secondary | ICD-10-CM | POA: Diagnosis not present

## 2023-11-01 DIAGNOSIS — Z3A15 15 weeks gestation of pregnancy: Secondary | ICD-10-CM | POA: Diagnosis not present

## 2023-11-01 DIAGNOSIS — G43009 Migraine without aura, not intractable, without status migrainosus: Secondary | ICD-10-CM

## 2023-11-01 MED ORDER — CAFFEINE 200 MG PO TABS
100.0000 mg | ORAL_TABLET | Freq: Once | ORAL | Status: AC
  Administered 2023-11-01: 100 mg via ORAL
  Filled 2023-11-01: qty 1

## 2023-11-01 NOTE — Discharge Instructions (Signed)
 For headaches at home, you can take tylenol  1g every 8 hours as needed. Make sure you're drinking plenty of fluids and getting rest.

## 2023-11-01 NOTE — MAU Note (Signed)
 Pt requesting to be d/c home after receiving caffeine  tablet. MD made aware.

## 2023-11-03 ENCOUNTER — Other Ambulatory Visit (HOSPITAL_COMMUNITY): Payer: Self-pay

## 2023-11-03 ENCOUNTER — Ambulatory Visit (INDEPENDENT_AMBULATORY_CARE_PROVIDER_SITE_OTHER): Admitting: Obstetrics & Gynecology

## 2023-11-03 VITALS — BP 123/71 | HR 103 | Wt 144.0 lb

## 2023-11-03 DIAGNOSIS — Z87898 Personal history of other specified conditions: Secondary | ICD-10-CM | POA: Diagnosis not present

## 2023-11-03 DIAGNOSIS — O26892 Other specified pregnancy related conditions, second trimester: Secondary | ICD-10-CM

## 2023-11-03 DIAGNOSIS — Z3402 Encounter for supervision of normal first pregnancy, second trimester: Secondary | ICD-10-CM | POA: Diagnosis not present

## 2023-11-03 DIAGNOSIS — Z3A15 15 weeks gestation of pregnancy: Secondary | ICD-10-CM | POA: Diagnosis not present

## 2023-11-03 DIAGNOSIS — R519 Headache, unspecified: Secondary | ICD-10-CM | POA: Diagnosis not present

## 2023-11-03 MED ORDER — SUMATRIPTAN SUCCINATE 100 MG PO TABS
100.0000 mg | ORAL_TABLET | ORAL | 0 refills | Status: DC | PRN
  Filled 2023-11-03: qty 10, 30d supply, fill #0

## 2023-11-03 NOTE — Progress Notes (Signed)
   PRENATAL VISIT NOTE  Subjective:  Molly Bryant is a 23 y.o. G1P0000 at 109w4d being seen today for ongoing prenatal care.  She is currently monitored for the following issues for this low-risk pregnancy and has History of seizure and Encounter for supervision of normal first pregnancy on their problem list.  Patient reports continued headaches/migraines with aura not alleviated by Tylenol  and Flexeril .  Contractions: Not present. Vag. Bleeding: None.  Movement: Absent. Denies leaking of fluid.   The following portions of the patient's history were reviewed and updated as appropriate: allergies, current medications, past family history, past medical history, past social history, past surgical history and problem list.   Objective:    Vitals:   11/03/23 1339  BP: 123/71  Pulse: (!) 103  Weight: 144 lb (65.3 kg)    Fetal Status:  Fetal Heart Rate (bpm): 148   Movement: Absent    General: Alert, oriented and cooperative. Patient is in no acute distress.  Skin: Skin is warm and dry. No rash noted.   Cardiovascular: Normal heart rate noted  Respiratory: Normal respiratory effort, no problems with respiration noted  Abdomen: Soft, gravid, appropriate for gestational age.  Pain/Pressure: Absent     Pelvic: Cervical exam deferred        Extremities: Normal range of motion.  Edema: None  Mental Status: Normal mood and affect. Normal behavior. Normal judgment and thought content.   Assessment and Plan:  Pregnancy: G1P0000 at [redacted]w[redacted]d 1. Pregnancy headache in second trimester (Primary) Will try Imitrex , advised to have adequate hydration too. Will follow up with our Headache Specialist here, also referred to Neurology. - SUMAtriptan  (IMITREX ) 100 MG tablet; Take 1 tablet (100 mg total) by mouth as needed for migraine. May repeat in 2 hours if headache persists or recurs.  Dispense: 10 tablet; Refill: 0 - Ambulatory referral to Neurology  2. History of seizure Had a few seizures over a  few months five years ago, was hospitalized for one week.  No known etiology. No seizures since. - Ambulatory referral to Neurology  3. [redacted] weeks gestation of pregnancy 4. Encounter for supervision of normal first pregnancy in second trimester LR NIPS, already scheduled for anatomy scan. - AFP, Serum, Open Spina Bifida No other complaints or concerns.  Routine obstetric precautions reviewed.  Please refer to After Visit Summary for other counseling recommendations.   Return in about 4 weeks (around 12/01/2023) for OFFICE OB VISIT (MD or APP).  Future Appointments  Date Time Provider Department Center  12/01/2023  2:30 PM Herchel Gloris LABOR, MD [facility]  12/08/2023  8:00 AM [facility] PROVIDER 1 [facility]  12/08/2023  8:30 AM [facility] US1 [facility]    Gloris Herchel, MD

## 2023-11-04 LAB — AFP, SERUM, OPEN SPINA BIFIDA
AFP MoM: 0.54
AFP Value: 19.3 ng/mL
Gest. Age on Collection Date: 15.6 wk
Maternal Age At EDD: 21.2 a
OSBR Risk 1 IN: 10000
Test Results:: NEGATIVE
Weight: 144 [lb_av]

## 2023-11-07 ENCOUNTER — Ambulatory Visit: Payer: Self-pay | Admitting: Obstetrics & Gynecology

## 2023-11-07 DIAGNOSIS — Z3402 Encounter for supervision of normal first pregnancy, second trimester: Secondary | ICD-10-CM

## 2023-11-25 ENCOUNTER — Encounter: Payer: Self-pay | Admitting: Neurology

## 2023-11-25 ENCOUNTER — Ambulatory Visit: Admitting: Neurology

## 2023-11-25 VITALS — BP 114/76 | HR 102 | Ht 60.0 in | Wt 149.0 lb

## 2023-11-25 DIAGNOSIS — Z3A18 18 weeks gestation of pregnancy: Secondary | ICD-10-CM

## 2023-11-25 DIAGNOSIS — G43709 Chronic migraine without aura, not intractable, without status migrainosus: Secondary | ICD-10-CM | POA: Diagnosis not present

## 2023-11-25 NOTE — Patient Instructions (Addendum)
 Discussed nonpharmacological approach, including good sleep, proper hydration and nutrition Start magnesium oxide 400 mg nightly, discussed possible side effect of diarrhea.  If headaches persist will consider propranolol as preventative medication Start Tylenol  1000 mg for headaches, can repeat a second dose if headache still persistent Can use ibuprofen  800 mg during the second trimester.  If Tylenol  not enough to control the headache, will consider combination of Tylenol  and Reglan .  And if that fails then we will recommend sumatriptan  Return in 4 weeks or sooner if worse
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
## 2023-12-01 ENCOUNTER — Ambulatory Visit: Admitting: Obstetrics & Gynecology

## 2023-12-01 VITALS — BP 115/74 | HR 86 | Wt 153.0 lb

## 2023-12-01 DIAGNOSIS — Z3402 Encounter for supervision of normal first pregnancy, second trimester: Secondary | ICD-10-CM | POA: Diagnosis not present

## 2023-12-01 DIAGNOSIS — Z3A19 19 weeks gestation of pregnancy: Secondary | ICD-10-CM | POA: Diagnosis not present

## 2023-12-01 DIAGNOSIS — Z87898 Personal history of other specified conditions: Secondary | ICD-10-CM | POA: Diagnosis not present

## 2023-12-01 NOTE — Progress Notes (Signed)
   PRENATAL VISIT NOTE  Subjective:  Molly Bryant is a 23 y.o. G1P0000 at [redacted]w[redacted]d being seen today for ongoing prenatal care.  She is currently monitored for the following issues for this low-risk pregnancy and has History of seizure and Encounter for supervision of normal first pregnancy on their problem list.  Patient reports no complaints.  Contractions: Not present. Vag. Bleeding: None.  Movement: Present. Denies leaking of fluid.   The following portions of the patient's history were reviewed and updated as appropriate: allergies, current medications, past family history, past medical history, past social history, past surgical history and problem list.   Objective:    Vitals:   12/01/23 1432  BP: 115/74  Pulse: 86  Weight: 153 lb (69.4 kg)    Fetal Status:  Fetal Heart Rate (bpm): 147   Movement: Present    General: Alert, oriented and cooperative. Patient is in no acute distress.  Skin: Skin is warm and dry. No rash noted.   Cardiovascular: Normal heart rate noted  Respiratory: Normal respiratory effort, no problems with respiration noted  Abdomen: Soft, gravid, appropriate for gestational age.  Pain/Pressure: Present     Pelvic: Cervical exam deferred        Extremities: Normal range of motion.     Mental Status: Normal mood and affect. Normal behavior. Normal judgment and thought content.   Assessment and Plan:  Pregnancy: G1P0000 at [redacted]w[redacted]d 1. History of seizure Seen by Neurology already, no medications.  2. [redacted] weeks gestation of pregnancy 3. Encounter for supervision of normal first pregnancy in second trimester (Primary) Anatomy scan already scheduled.  Preterm labor symptoms and general obstetric precautions including but not limited to vaginal bleeding, contractions, leaking of fluid and fetal movement were reviewed in detail with the patient. Please refer to After Visit Summary for other counseling recommendations.   Return in about 4 weeks (around  12/29/2023) for OFFICE OB VISIT (MD or APP).  Future Appointments  Date Time Provider Department Center  12/08/2023  8:00 AM [facility] PROVIDER 1 [facility]  12/08/2023  8:30 AM [facility] US1 [facility]  12/16/2023  9:30 AM Teague Gretta Darice FORBES DEVONNA [facility]  12/27/2023  3:45 PM Gregg Lek, MD [facility] None  12/29/2023  3:50 PM Fredirick Glenys RAMAN, MD [facility]    Gloris Hugger, MD

## 2023-12-02 ENCOUNTER — Institutional Professional Consult (permissible substitution): Admitting: Physician Assistant

## 2023-12-08 ENCOUNTER — Ambulatory Visit: Attending: Maternal & Fetal Medicine | Admitting: Maternal & Fetal Medicine

## 2023-12-08 ENCOUNTER — Ambulatory Visit (HOSPITAL_BASED_OUTPATIENT_CLINIC_OR_DEPARTMENT_OTHER)

## 2023-12-08 ENCOUNTER — Other Ambulatory Visit: Payer: Self-pay | Admitting: *Deleted

## 2023-12-08 VITALS — BP 120/66 | HR 93

## 2023-12-08 DIAGNOSIS — O418X20 Other specified disorders of amniotic fluid and membranes, second trimester, not applicable or unspecified: Secondary | ICD-10-CM | POA: Diagnosis not present

## 2023-12-08 DIAGNOSIS — Z363 Encounter for antenatal screening for malformations: Secondary | ICD-10-CM | POA: Diagnosis not present

## 2023-12-08 DIAGNOSIS — Z3A20 20 weeks gestation of pregnancy: Secondary | ICD-10-CM | POA: Diagnosis not present

## 2023-12-08 DIAGNOSIS — Z362 Encounter for other antenatal screening follow-up: Secondary | ICD-10-CM

## 2023-12-08 DIAGNOSIS — G40909 Epilepsy, unspecified, not intractable, without status epilepticus: Secondary | ICD-10-CM | POA: Diagnosis not present

## 2023-12-08 DIAGNOSIS — Z3689 Encounter for other specified antenatal screening: Secondary | ICD-10-CM | POA: Diagnosis not present

## 2023-12-08 DIAGNOSIS — O4592 Premature separation of placenta, unspecified, second trimester: Secondary | ICD-10-CM | POA: Diagnosis not present

## 2023-12-08 DIAGNOSIS — Z3400 Encounter for supervision of normal first pregnancy, unspecified trimester: Secondary | ICD-10-CM

## 2023-12-08 DIAGNOSIS — O2692 Pregnancy related conditions, unspecified, second trimester: Secondary | ICD-10-CM | POA: Insufficient documentation

## 2023-12-08 DIAGNOSIS — O468X2 Other antepartum hemorrhage, second trimester: Secondary | ICD-10-CM

## 2023-12-08 DIAGNOSIS — Z3A19 19 weeks gestation of pregnancy: Secondary | ICD-10-CM | POA: Insufficient documentation

## 2023-12-08 DIAGNOSIS — Z87898 Personal history of other specified conditions: Secondary | ICD-10-CM

## 2023-12-08 DIAGNOSIS — O99352 Diseases of the nervous system complicating pregnancy, second trimester: Secondary | ICD-10-CM | POA: Diagnosis not present

## 2023-12-08 DIAGNOSIS — Z8669 Personal history of other diseases of the nervous system and sense organs: Secondary | ICD-10-CM | POA: Diagnosis not present
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
## 2023-12-16 ENCOUNTER — Encounter: Payer: Self-pay | Admitting: Physician Assistant

## 2023-12-16 ENCOUNTER — Ambulatory Visit (INDEPENDENT_AMBULATORY_CARE_PROVIDER_SITE_OTHER): Admitting: Physician Assistant

## 2023-12-16 ENCOUNTER — Other Ambulatory Visit (HOSPITAL_COMMUNITY): Payer: Self-pay

## 2023-12-16 VITALS — BP 116/75 | HR 88 | Wt 157.0 lb

## 2023-12-16 DIAGNOSIS — O26892 Other specified pregnancy related conditions, second trimester: Secondary | ICD-10-CM | POA: Insufficient documentation

## 2023-12-16 DIAGNOSIS — R519 Headache, unspecified: Secondary | ICD-10-CM | POA: Insufficient documentation

## 2023-12-16 DIAGNOSIS — G43001 Migraine without aura, not intractable, with status migrainosus: Secondary | ICD-10-CM | POA: Diagnosis not present

## 2023-12-16 MED ORDER — CYCLOBENZAPRINE HCL 10 MG PO TABS
5.0000 mg | ORAL_TABLET | Freq: Three times a day (TID) | ORAL | 1 refills | Status: DC | PRN
  Filled 2023-12-16: qty 30, 20d supply, fill #0

## 2023-12-16 MED ORDER — BUTALBITAL-APAP-CAFFEINE 50-325-40 MG PO CAPS: 1.0000 | ORAL_CAPSULE | Freq: Four times a day (QID) | ORAL | 1 refills | Status: DC | PRN
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
## 2023-12-27 ENCOUNTER — Other Ambulatory Visit (HOSPITAL_COMMUNITY): Payer: Self-pay

## 2023-12-27 ENCOUNTER — Encounter: Payer: Self-pay | Admitting: Neurology

## 2023-12-27 ENCOUNTER — Ambulatory Visit: Admitting: Neurology

## 2023-12-27 ENCOUNTER — Inpatient Hospital Stay (HOSPITAL_COMMUNITY)
Admission: AD | Admit: 2023-12-27 | Discharge: 2023-12-28 | Disposition: A | Discharge status: Home / Self Care | Attending: Obstetrics and Gynecology | Admitting: Obstetrics and Gynecology

## 2023-12-27 VITALS — BP 117/77 | HR 105 | Ht 60.0 in | Wt 156.5 lb

## 2023-12-27 DIAGNOSIS — Z3A22 22 weeks gestation of pregnancy: Secondary | ICD-10-CM | POA: Insufficient documentation

## 2023-12-27 DIAGNOSIS — G43709 Chronic migraine without aura, not intractable, without status migrainosus: Secondary | ICD-10-CM

## 2023-12-27 DIAGNOSIS — R1023 Pelvic and perineal pain bilateral: Secondary | ICD-10-CM | POA: Insufficient documentation

## 2023-12-27 DIAGNOSIS — R102 Pelvic and perineal pain unspecified side: Secondary | ICD-10-CM
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
## 2023-12-27 NOTE — Patient Instructions (Addendum)
 Switch to Magnesium Glycinate 250 mg  Use Riboflavin 400 mg nightly  Use Coenzyme Q10 100 mg nightly Will start Propranolol if preventive medications ineffective  Continue Phenergan  and Flexeril  for acute therapy  Can use Sumatriptan  but no more than twice a week  Consider massage therapy and acupuncture   Please call with updates within a month

## 2023-12-28 ENCOUNTER — Encounter (HOSPITAL_COMMUNITY): Payer: Self-pay | Admitting: Obstetrics and Gynecology

## 2023-12-28 DIAGNOSIS — R1023 Pelvic and perineal pain bilateral: Secondary | ICD-10-CM | POA: Diagnosis not present

## 2023-12-28 DIAGNOSIS — O26892 Other specified pregnancy related conditions, second trimester: Secondary | ICD-10-CM | POA: Diagnosis not present

## 2023-12-28 DIAGNOSIS — M5459 Other low back pain: Secondary | ICD-10-CM | POA: Diagnosis present

## 2023-12-28 DIAGNOSIS — R102 Pelvic and perineal pain unspecified side: Secondary | ICD-10-CM | POA: Diagnosis present

## 2023-12-28 DIAGNOSIS — Z3A22 22 weeks gestation of pregnancy: Secondary | ICD-10-CM | POA: Diagnosis not present

## 2023-12-28 DIAGNOSIS — R1030 Lower abdominal pain, unspecified: Secondary | ICD-10-CM | POA: Diagnosis present

## 2023-12-28 LAB — URINALYSIS, ROUTINE W REFLEX MICROSCOPIC
Bilirubin Urine: NEGATIVE
Glucose, UA: NEGATIVE mg/dL
Hgb urine dipstick: NEGATIVE
Ketones, ur: NEGATIVE mg/dL
Leukocytes,Ua: NEGATIVE
Nitrite: NEGATIVE
Protein, ur: NEGATIVE mg/dL
Specific Gravity, Urine: 1.006 (ref 1.005–1.030)
pH: 6 (ref 5.0–8.0)

## 2023-12-28 MED ORDER — ACETAMINOPHEN 500 MG PO TABS
1000.0000 mg | ORAL_TABLET | Freq: Once | ORAL | Status: AC
  Administered 2023-12-28: 1000 mg via ORAL
  Filled 2023-12-28: qty 2

## 2023-12-28 NOTE — Discharge Instructions (Addendum)
 You came into the MAU because you were having lower abdominal pain that radiates into the back. Based on your exam, you have pain in your pelvic girdle. This can happen when you're pregnant, and cause pain in your back and get worse when doing weight-bearing activities like walking. You can try medication like tylenol  to help with discomfort. Some non-medication options are doing warm or cold compresses, stretching and massage. While walking, you can wear a pregnancy support belt to take tension off of the pelvis. If none of the above are helpful, you can speak with your OB about trying pelvic floor physical therapy.
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
## 2023-12-28 NOTE — MAU Note (Signed)
 Molly Bryant is a 23 y.o. at [redacted]w[redacted]d here in MAU reporting pain in lower abd and lower back and pubic pain. Mostly here due to pubic pain which started today and is worse with movement, esp walking. Denies VB or LOF. Reports feeling FM  LMP: na Onset of complaint: today Pain score: 7 Vitals:   12/28/23 0005 12/28/23 0009  BP:  110/70  Pulse: 88   Resp: 17   Temp: 98.3 F (36.8 C)   SpO2: 98%      FHT: 148  Lab orders placed from triage: ua

## 2023-12-29 ENCOUNTER — Ambulatory Visit (INDEPENDENT_AMBULATORY_CARE_PROVIDER_SITE_OTHER): Admitting: Family Medicine

## 2023-12-29 VITALS — BP 127/78 | HR 82 | Wt 162.0 lb

## 2023-12-29 DIAGNOSIS — Z3402 Encounter for supervision of normal first pregnancy, second trimester: Secondary | ICD-10-CM

## 2023-12-29 DIAGNOSIS — Z3A22 22 weeks gestation of pregnancy: Secondary | ICD-10-CM

## 2023-12-29 NOTE — Progress Notes (Signed)
 PRENATAL VISIT NOTE  Subjective:  Molly Bryant is a 23 y.o. G1P0000 at [redacted]w[redacted]d being seen today for ongoing prenatal care.  She is currently monitored for the following issues for this low-risk pregnancy and has Migraine headache without aura; History of seizure; Encounter for supervision of normal first pregnancy; Subchorionic hematoma in second trimester; and Headache in pregnancy, antepartum, second trimester on their problem list.  Patient reports backache.  Contractions: Irritability. Vag. Bleeding: None.  Movement: Present. Denies leaking of fluid.   The following portions of the patient's history were reviewed and updated as appropriate: allergies, current medications, past family history, past medical history, past social history, past surgical history and problem list.   Objective:   Vitals:   12/29/23 1600  BP: 127/78  Pulse: 82  Weight: 162 lb (73.5 kg)    Fetal Status:  Fetal Heart Rate (bpm): 154 Fundal Height: 22 cm Movement: Present    General: Alert, oriented and cooperative. Patient is in no acute distress.  Skin: Skin is warm and dry. No rash noted.   Cardiovascular: Normal heart rate noted  Respiratory: Normal respiratory effort, no problems with respiration noted  Abdomen: Soft, gravid, appropriate for gestational age.  Pain/Pressure: Present     Pelvic: Cervical exam deferred        Extremities: Normal range of motion.  Edema: None  Mental Status: Normal mood and affect. Normal behavior. Normal judgment and thought content.      03/17/2023   10:40 AM 12/08/2022    3:21 PM 08/27/2022    3:41 PM  Depression screen PHQ 2/9  Decreased Interest 0 0 1  Down, Depressed, Hopeless 0 0 0  PHQ - 2 Score 0 0 1  Altered sleeping  0 1  Tired, decreased energy  0 1  Change in appetite  0 1  Feeling bad or failure about yourself   0 0  Trouble concentrating  0 1  Moving slowly or fidgety/restless  0 0  Suicidal thoughts  0 0  PHQ-9 Score  0  5   Difficult doing  work/chores   Very difficult     Data saved with a previous flowsheet row definition        12/08/2022    3:21 PM 08/27/2022    3:41 PM  GAD 7 : Generalized Anxiety Score  Nervous, Anxious, on Edge 0 1  Control/stop worrying 0 1  Worry too much - different things 0 1  Trouble relaxing 0 1  Restless 0 1  Easily annoyed or irritable 0 1  Afraid - awful might happen 0 1  Total GAD 7 Score 0 7  Anxiety Difficulty Not difficult at all Very difficult    Assessment and Plan:  Pregnancy: G1P0000 at [redacted]w[redacted]d 1. Encounter for supervision of normal first pregnancy in second trimester (Primary) Suspect back pain is related to standing and posture at work with advancing pregnancy Trial of maternity belt  2. [redacted] weeks gestation of pregnancy Continue prenatal care.  Preterm labor symptoms and general obstetric precautions including but not limited to vaginal bleeding, contractions, leaking of fluid and fetal movement were reviewed in detail with the patient. Please refer to After Visit Summary for other counseling recommendations.   Return in about 4 weeks (around 01/26/2024) for 28 wk labs.  Future Appointments  Date Time Provider Department Center  01/27/2024  8:15 AM [facility]  01/27/2024  8:35 AM Eldonna Suzen Octave, MD [facility]  02/10/2024 10:15 AM [facility] PROVIDER 1  [facility]  02/10/2024 10:30 AM [facility] US2 [facility]  08/01/2024  2:45 PM Gregg Lek, MD [facility] None    Glenys GORMAN Birk, MD

## 2024-01-02 ENCOUNTER — Encounter: Payer: Self-pay | Admitting: Family Medicine

## 2024-01-05 ENCOUNTER — Inpatient Hospital Stay (HOSPITAL_COMMUNITY)
Admission: AD | Admit: 2024-01-05 | Discharge: 2024-01-05 | Disposition: A | Discharge status: Home / Self Care | Payer: Self-pay | Attending: Obstetrics and Gynecology | Admitting: Obstetrics and Gynecology

## 2024-01-05 ENCOUNTER — Other Ambulatory Visit: Payer: Self-pay

## 2024-01-05 DIAGNOSIS — Z3A23 23 weeks gestation of pregnancy: Secondary | ICD-10-CM | POA: Diagnosis not present

## 2024-01-05 DIAGNOSIS — R040 Epistaxis: Secondary | ICD-10-CM | POA: Insufficient documentation

## 2024-01-05 DIAGNOSIS — O26892 Other specified pregnancy related conditions, second trimester: Secondary | ICD-10-CM | POA: Diagnosis not present

## 2024-01-05 DIAGNOSIS — Z7982 Long term (current) use of aspirin: Secondary | ICD-10-CM | POA: Insufficient documentation

## 2024-01-05 DIAGNOSIS — Z3402 Encounter for supervision of normal first pregnancy, second trimester: Secondary | ICD-10-CM

## 2024-01-05 NOTE — MAU Note (Signed)
 Molly Bryant is a 23 y.o. at [redacted]w[redacted]d here in MAU reporting: she had a severe nose bleed that had blood clots.  States nose bleed started at 1655 and stopped @ 1730.  Reports she wasn't sure if she had too much blood, takes aspirin .   Denies VB and LOF.  Endorses +FM.  LMP: 07/12/2023 Onset of complaint: today Pain score: 0 Vitals:   01/05/24 1855  BP: 131/80  Pulse: (!) 102  Resp: 19  Temp: 98.2 F (36.8 C)  SpO2: 100%     FHT: 150 bpm  Lab orders placed from triage: None
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
## 2024-01-27 ENCOUNTER — Ambulatory Visit: Admitting: Family Medicine

## 2024-01-27 ENCOUNTER — Other Ambulatory Visit

## 2024-01-27 VITALS — BP 121/76 | HR 93 | Wt 168.0 lb

## 2024-01-27 DIAGNOSIS — Z3A27 27 weeks gestation of pregnancy: Secondary | ICD-10-CM

## 2024-01-27 DIAGNOSIS — Z23 Encounter for immunization: Secondary | ICD-10-CM | POA: Diagnosis not present

## 2024-01-27 DIAGNOSIS — Z3400 Encounter for supervision of normal first pregnancy, unspecified trimester: Secondary | ICD-10-CM

## 2024-01-27 DIAGNOSIS — O468X2 Other antepartum hemorrhage, second trimester: Secondary | ICD-10-CM

## 2024-01-27 DIAGNOSIS — Z3402 Encounter for supervision of normal first pregnancy, second trimester: Secondary | ICD-10-CM

## 2024-01-27 DIAGNOSIS — O418X20 Other specified disorders of amniotic fluid and membranes, second trimester, not applicable or unspecified: Secondary | ICD-10-CM | POA: Diagnosis not present

## 2024-01-27 NOTE — Progress Notes (Unsigned)
 ROB: Denies any concerns    GTT today

## 2024-01-28 LAB — CBC
Hematocrit: 32.1 % — ABNORMAL LOW (ref 34.0–46.6)
Hemoglobin: 10.4 g/dL — ABNORMAL LOW (ref 11.1–15.9)
MCH: 28.9 pg (ref 26.6–33.0)
MCHC: 32.4 g/dL (ref 31.5–35.7)
MCV: 89 fL (ref 79–97)
Platelets: 197 x10E3/uL (ref 150–450)
RBC: 3.6 x10E6/uL — ABNORMAL LOW (ref 3.77–5.28)
RDW: 13 % (ref 11.7–15.4)
WBC: 8.4 x10E3/uL (ref 3.4–10.8)

## 2024-01-28 LAB — GLUCOSE TOLERANCE, 2 HOURS W/ 1HR
Glucose, 1 hour: 122 mg/dL (ref 70–179)
Glucose, 2 hour: 98 mg/dL (ref 70–152)
Glucose, Fasting: 84 mg/dL (ref 70–91)

## 2024-01-28 LAB — HIV ANTIBODY (ROUTINE TESTING W REFLEX): HIV Screen 4th Generation wRfx: NONREACTIVE

## 2024-01-28 LAB — SYPHILIS: RPR W/REFLEX TO RPR TITER AND TREPONEMAL ANTIBODIES, TRADITIONAL SCREENING AND DIAGNOSIS ALGORITHM: RPR Ser Ql: NONREACTIVE

## 2024-01-31 ENCOUNTER — Ambulatory Visit: Payer: Self-pay | Admitting: Family Medicine

## 2024-01-31 DIAGNOSIS — Z3403 Encounter for supervision of normal first pregnancy, third trimester: Secondary | ICD-10-CM

## 2024-01-31 NOTE — Progress Notes (Signed)
 "  PRENATAL VISIT NOTE  Subjective:  Molly Bryant is a 23 y.o. G1P0000 at [redacted]w[redacted]d being seen today for ongoing prenatal care.  She is currently monitored for the following issues for this low-risk pregnancy and has Migraine headache without aura; History of seizure; Encounter for supervision of normal first pregnancy; Subchorionic hematoma in second trimester; and Headache in pregnancy, antepartum, second trimester on their problem list.  Patient reports no complaints.  Contractions: Not present. Vag. Bleeding: None.  Movement: Present. Denies leaking of fluid.   The following portions of the patient's history were reviewed and updated as appropriate: allergies, current medications, past family history, past medical history, past social history, past surgical history and problem list.   Objective:   Vitals:   01/27/24 0852  BP: 121/76  Pulse: 93  Weight: 168 lb (76.2 kg)    Fetal Status:  Fetal Heart Rate (bpm): 156 Fundal Height: 28 cm Movement: Present    General: Alert, oriented and cooperative. Patient is in no acute distress.  Skin: Skin is warm and dry. No rash noted.   Cardiovascular: Normal heart rate noted  Respiratory: Normal respiratory effort, no problems with respiration noted  Abdomen: Soft, gravid, appropriate for gestational age.  Pain/Pressure: Absent     Pelvic: Cervical exam deferred        Extremities: Normal range of motion.  Edema: None  Mental Status: Normal mood and affect. Normal behavior. Normal judgment and thought content.      03/17/2023   10:40 AM 12/08/2022    3:21 PM 08/27/2022    3:41 PM  Depression screen PHQ 2/9  Decreased Interest 0 0 1  Down, Depressed, Hopeless 0 0 0  PHQ - 2 Score 0 0 1  Altered sleeping  0 1  Tired, decreased energy  0 1  Change in appetite  0 1  Feeling bad or failure about yourself   0 0  Trouble concentrating  0 1  Moving slowly or fidgety/restless  0 0  Suicidal thoughts  0 0  PHQ-9 Score  0  5   Difficult  doing work/chores   Very difficult     Data saved with a previous flowsheet row definition        12/08/2022    3:21 PM 08/27/2022    3:41 PM  GAD 7 : Generalized Anxiety Score  Nervous, Anxious, on Edge 0 1  Control/stop worrying 0 1  Worry too much - different things 0 1  Trouble relaxing 0 1  Restless 0 1  Easily annoyed or irritable 0 1  Afraid - awful might happen 0 1  Total GAD 7 Score 0 7  Anxiety Difficulty Not difficult at all Very difficult    Assessment and Plan:  Pregnancy: G1P0000 at [redacted]w[redacted]d 1. Encounter for supervision of normal first pregnancy in second trimester (Primary) UP to date Vigorous movement FH appropriate Completing 3rd trimester labs Concerns- none  2. Subchorionic hematoma in second trimester, single or unspecified fetus No bleeding   Preterm labor symptoms and general obstetric precautions including but not limited to vaginal bleeding, contractions, leaking of fluid and fetal movement were reviewed in detail with the patient. Please refer to After Visit Summary for other counseling recommendations.   Return in about 2 weeks (around 02/10/2024) for Routine prenatal care.  Future Appointments  Date Time Provider Department Center  02/07/2024  8:15 AM [facility] PROVIDER 1 [facility]  02/07/2024  8:30 AM [facility] US4 [facility]  02/15/2024  3:30 PM Letha,  Ala, CNM [facility]  02/28/2024  3:50 PM Izell Harari, MD [facility]  03/13/2024  3:50 PM Anyanwu, Gloris LABOR, MD [facility]  03/27/2024  3:50 PM Anyanwu, Gloris LABOR, MD [facility]  08/01/2024  2:45 PM Gregg Lek, MD [facility] None    Suzen Maryan Masters, MD  "

## 2024-02-07 ENCOUNTER — Ambulatory Visit (HOSPITAL_BASED_OUTPATIENT_CLINIC_OR_DEPARTMENT_OTHER): Admitting: Obstetrics

## 2024-02-07 ENCOUNTER — Ambulatory Visit: Attending: Maternal & Fetal Medicine

## 2024-02-07 VITALS — BP 132/80

## 2024-02-07 DIAGNOSIS — Z3A28 28 weeks gestation of pregnancy: Secondary | ICD-10-CM

## 2024-02-07 DIAGNOSIS — Z362 Encounter for other antenatal screening follow-up: Secondary | ICD-10-CM | POA: Diagnosis present

## 2024-02-07 DIAGNOSIS — Z3402 Encounter for supervision of normal first pregnancy, second trimester: Secondary | ICD-10-CM | POA: Insufficient documentation

## 2024-02-07 DIAGNOSIS — O468X3 Other antepartum hemorrhage, third trimester: Secondary | ICD-10-CM | POA: Diagnosis not present

## 2024-02-07 DIAGNOSIS — O418X30 Other specified disorders of amniotic fluid and membranes, third trimester, not applicable or unspecified: Secondary | ICD-10-CM | POA: Insufficient documentation

## 2024-02-07 DIAGNOSIS — R569 Unspecified convulsions: Secondary | ICD-10-CM

## 2024-02-07 DIAGNOSIS — O468X2 Other antepartum hemorrhage, second trimester: Secondary | ICD-10-CM | POA: Insufficient documentation

## 2024-02-07 DIAGNOSIS — O26893 Other specified pregnancy related conditions, third trimester: Secondary | ICD-10-CM | POA: Diagnosis not present

## 2024-02-07 DIAGNOSIS — O418X20 Other specified disorders of amniotic fluid and membranes, second trimester, not applicable or unspecified: Secondary | ICD-10-CM | POA: Insufficient documentation

## 2024-02-07 DIAGNOSIS — O4592 Premature separation of placenta, unspecified, second trimester: Secondary | ICD-10-CM | POA: Diagnosis not present

## 2024-02-07 NOTE — Progress Notes (Signed)
 MFM Consult Note  Molly Bryant is currently at [redacted]w[redacted]d. She has been followed due to a subchorionic hematoma noted earlier in her pregnancy.   She denies any problems since her last exam and screened negative for gestational diabetes in her current pregnancy.  Her blood pressure today was 132/80.  Sonographic findings Single intrauterine pregnancy at 28w 3d.  Fetal cardiac activity:  Observed and appears normal. Presentation: Cephalic. Fetal biometry shows the estimated fetal weight of 3 lb,  1351g (67%). Amniotic fluid volume: Within normal limits. AFI: 17.54cm.  MVP: 6.86 cm. Placenta: Anterior.  A normal-appearing anterior placenta was noted on today's exam.  The previously noted subchorionic hematoma has resolved.  As the fetal growth is within normal limits and the subchorionic hematoma has resolved, no further exams were scheduled in our office.    She was advised to continue routine prenatal care.  The patient stated that all of her questions were answered.   A total of 20 minutes was spent counseling and coordinating the care for this patient.  Greater than 50% of the time was spent in direct face-to-face contact.

## 2024-02-10 ENCOUNTER — Ambulatory Visit

## 2024-02-12 ENCOUNTER — Other Ambulatory Visit: Payer: Self-pay

## 2024-02-12 ENCOUNTER — Inpatient Hospital Stay (HOSPITAL_COMMUNITY)

## 2024-02-12 ENCOUNTER — Inpatient Hospital Stay (HOSPITAL_COMMUNITY)
Admission: AD | Admit: 2024-02-12 | Discharge: 2024-02-12 | Disposition: A | Discharge status: Home / Self Care | Attending: Obstetrics and Gynecology | Admitting: Obstetrics and Gynecology

## 2024-02-12 ENCOUNTER — Encounter (HOSPITAL_COMMUNITY): Payer: Self-pay | Admitting: Obstetrics and Gynecology

## 2024-02-12 DIAGNOSIS — Z3A29 29 weeks gestation of pregnancy: Secondary | ICD-10-CM | POA: Diagnosis not present

## 2024-02-12 DIAGNOSIS — R197 Diarrhea, unspecified: Secondary | ICD-10-CM | POA: Diagnosis present

## 2024-02-12 DIAGNOSIS — R1013 Epigastric pain: Secondary | ICD-10-CM | POA: Insufficient documentation

## 2024-02-12 DIAGNOSIS — R109 Unspecified abdominal pain: Secondary | ICD-10-CM

## 2024-02-12 DIAGNOSIS — O219 Vomiting of pregnancy, unspecified: Secondary | ICD-10-CM | POA: Insufficient documentation

## 2024-02-12 DIAGNOSIS — O26893 Other specified pregnancy related conditions, third trimester: Secondary | ICD-10-CM | POA: Insufficient documentation

## 2024-02-12 DIAGNOSIS — R1011 Right upper quadrant pain: Secondary | ICD-10-CM | POA: Diagnosis present

## 2024-02-12 DIAGNOSIS — R1110 Vomiting, unspecified: Secondary | ICD-10-CM | POA: Diagnosis present

## 2024-02-12 DIAGNOSIS — O26899 Other specified pregnancy related conditions, unspecified trimester: Secondary | ICD-10-CM

## 2024-02-12 LAB — COMPREHENSIVE METABOLIC PANEL WITH GFR
ALT: 27 U/L (ref 0–44)
AST: 33 U/L (ref 15–41)
Albumin: 3.6 g/dL (ref 3.5–5.0)
Alkaline Phosphatase: 113 U/L (ref 38–126)
Anion gap: 11 (ref 5–15)
BUN: <5 mg/dL — ABNORMAL LOW (ref 6–20)
CO2: 20 mmol/L — ABNORMAL LOW (ref 22–32)
Calcium: 8.9 mg/dL (ref 8.9–10.3)
Chloride: 105 mmol/L (ref 98–111)
Creatinine, Ser: 0.45 mg/dL (ref 0.44–1.00)
GFR, Estimated: >60 mL/min
Glucose, Bld: 76 mg/dL (ref 70–99)
Potassium: 3.9 mmol/L (ref 3.5–5.1)
Sodium: 137 mmol/L (ref 135–145)
Total Bilirubin: 0.3 mg/dL (ref 0.0–1.2)
Total Protein: 7 g/dL (ref 6.5–8.1)

## 2024-02-12 LAB — URINALYSIS, ROUTINE W REFLEX MICROSCOPIC
Bilirubin Urine: NEGATIVE
Glucose, UA: NEGATIVE mg/dL
Hgb urine dipstick: NEGATIVE
Ketones, ur: NEGATIVE mg/dL
Leukocytes,Ua: NEGATIVE
Nitrite: NEGATIVE
Protein, ur: NEGATIVE mg/dL
Specific Gravity, Urine: >1.03 — ABNORMAL HIGH (ref 1.005–1.030)
pH: 5.5 (ref 5.0–8.0)

## 2024-02-12 LAB — CBC
HCT: 30.1 % — ABNORMAL LOW (ref 36.0–46.0)
Hemoglobin: 10.4 g/dL — ABNORMAL LOW (ref 12.0–15.0)
MCH: 31.9 pg (ref 26.0–34.0)
MCHC: 34.6 g/dL (ref 30.0–36.0)
MCV: 92.3 fL (ref 80.0–100.0)
Platelets: 188 K/uL (ref 150–400)
RBC: 3.26 MIL/uL — ABNORMAL LOW (ref 3.87–5.11)
RDW: 14.5 % (ref 11.5–15.5)
WBC: 9.2 K/uL (ref 4.0–10.5)
nRBC: 0 % (ref 0.0–0.2)

## 2024-02-12 LAB — AMYLASE: Amylase: 61 U/L (ref 28–100)

## 2024-02-12 LAB — LIPASE, BLOOD: Lipase: 23 U/L (ref 11–51)

## 2024-02-12 MED ORDER — PROMETHAZINE HCL 12.5 MG PO TABS: 12.5000 mg | ORAL_TABLET | Freq: Four times a day (QID) | ORAL | 0 refills | Status: AC | PRN

## 2024-02-12 MED ORDER — ONDANSETRON HCL 4 MG/2ML IJ SOLN
4.0000 mg | Freq: Once | INTRAMUSCULAR | Status: AC
  Administered 2024-02-12: 4 mg via INTRAVENOUS
  Filled 2024-02-12: qty 2

## 2024-02-12 MED ORDER — HYDROMORPHONE HCL 1 MG/ML IJ SOLN
1.0000 mg | Freq: Once | INTRAMUSCULAR | Status: AC
  Administered 2024-02-12: 1 mg via INTRAVENOUS
  Filled 2024-02-12: qty 1

## 2024-02-12 NOTE — MAU Note (Signed)
 Trisa Cranor is a 24 y.o. at [redacted]w[redacted]d here in MAU reporting: ongoing, constant RUQ pain for around 2 weeks with intermittent episodes of diarrhea and vomiting. Had an episode last night of diarrhea x 8 and vomiting x 1 starting around 1am.  LMP: 07/12/23 Onset of complaint: 1am on 02/12/24 Pain score: 8/10 RUQ Abdominal Pain Vitals:   02/12/24 0910 02/12/24 0911  BP: 129/83   Pulse: 100   Resp: 15   Temp: 97.7 F (36.5 C)   SpO2:  97%     FHT: 149bpm Lab orders placed from triage: Urinalysis
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
## 2024-02-15 ENCOUNTER — Ambulatory Visit: Admitting: Obstetrics and Gynecology

## 2024-02-15 VITALS — BP 124/81 | HR 120 | Wt 173.0 lb

## 2024-02-15 DIAGNOSIS — Z3403 Encounter for supervision of normal first pregnancy, third trimester: Secondary | ICD-10-CM | POA: Diagnosis not present

## 2024-02-15 DIAGNOSIS — R1011 Right upper quadrant pain: Secondary | ICD-10-CM

## 2024-02-15 DIAGNOSIS — Z3A29 29 weeks gestation of pregnancy: Secondary | ICD-10-CM | POA: Diagnosis not present

## 2024-02-15 NOTE — Progress Notes (Signed)
"  ° °  LOW-RISK PREGNANCY OFFICE VISIT Patient name: Molly Bryant MRN [id number]  Date of birth: 11-15-2000 Chief Complaint:   Routine Prenatal Visit  History of Present Illness:   Molly Bryant is a 24 y.o. G60P0000 female at [redacted]w[redacted]d with an Estimated Date of Delivery: 04/28/24 being seen today for ongoing management of a low-risk pregnancy.  Today she reports RUQ pain - abdomen is sore to the touch. She was seen in MAU for this complaint on 02/12/2024. Contractions: Irritability. Vag. Bleeding: None.  Movement: Present. denies leaking of fluid. Review of Systems:   Pertinent items are noted in HPI Denies abnormal vaginal discharge w/ itching/odor/irritation, headaches, visual changes, shortness of breath, chest pain, abdominal pain, severe nausea/vomiting, or problems with urination or bowel movements unless otherwise stated above. Pertinent History Reviewed:  Reviewed past medical,surgical, social, obstetrical and family history.  Reviewed problem list, medications and allergies. Physical Assessment:   Vitals:   02/15/24 1544  BP: 124/81  Pulse: (!) 120  Weight: 173 lb (78.5 kg)  Body mass index is 33.79 kg/m.        Physical Examination:   General appearance: Well appearing, and in no distress  Mental status: Alert, oriented to person, place, and time  Skin: Warm & dry  Cardiovascular: Normal heart rate noted  Respiratory: Normal respiratory effort, no distress  Abdomen: Soft, gravid, nontender  Pelvic: Cervical exam deferred         Extremities:    Fetal Status: Fetal Heart Rate (bpm): 143 Fundal Height: 31 cm Movement: Present    No results found for this or any previous visit (from the past 24 hours).  Assessment & Plan:  1) Low-risk pregnancy G1P0000 at [redacted]w[redacted]d with an Estimated Date of Delivery: 04/28/24   2) Encounter for supervision of normal first pregnancy in third trimester (Primary) - Doing well - Feeling vigorous FM  3) Colicky RUQ abdominal pain - Explained  that the soreness can be a result of small fetal parts that continuously   4) [redacted] weeks gestation of pregnancy    Meds: No orders of the defined types were placed in this encounter.  Labs/procedures today: none  Plan:  Continue routine obstetrical care   Reviewed: Preterm labor symptoms and general obstetric precautions including but not limited to vaginal bleeding, contractions, leaking of fluid and fetal movement were reviewed in detail with the patient.  All questions were answered. Has home bp cuff. Check bp weekly, let us  know if >140/90.   Follow-up: Return in 13 days (on 02/28/2024) for Return OB visit.  No orders of the defined types were placed in this encounter.  Jakiya Bookbinder MSN, CNM 02/15/2024 4:03 PM "

## 2024-02-28 ENCOUNTER — Ambulatory Visit (INDEPENDENT_AMBULATORY_CARE_PROVIDER_SITE_OTHER): Admitting: Obstetrics and Gynecology

## 2024-02-28 ENCOUNTER — Other Ambulatory Visit (HOSPITAL_COMMUNITY): Payer: Self-pay

## 2024-02-28 VITALS — BP 134/78 | HR 112 | Wt 175.0 lb

## 2024-02-28 DIAGNOSIS — O418X20 Other specified disorders of amniotic fluid and membranes, second trimester, not applicable or unspecified: Secondary | ICD-10-CM

## 2024-02-28 DIAGNOSIS — O99013 Anemia complicating pregnancy, third trimester: Secondary | ICD-10-CM | POA: Diagnosis not present

## 2024-02-28 DIAGNOSIS — O418X30 Other specified disorders of amniotic fluid and membranes, third trimester, not applicable or unspecified: Secondary | ICD-10-CM

## 2024-02-28 DIAGNOSIS — Z3A31 31 weeks gestation of pregnancy: Secondary | ICD-10-CM | POA: Diagnosis not present

## 2024-02-28 DIAGNOSIS — O468X3 Other antepartum hemorrhage, third trimester: Secondary | ICD-10-CM | POA: Diagnosis not present

## 2024-02-28 MED ORDER — FERROUS SULFATE 324 MG PO TBEC
324.0000 mg | DELAYED_RELEASE_TABLET | ORAL | 0 refills | Status: AC
  Filled 2024-02-28: qty 42, 84d supply, fill #0

## 2024-03-01 ENCOUNTER — Other Ambulatory Visit: Payer: Self-pay

## 2024-03-01 ENCOUNTER — Encounter (HOSPITAL_COMMUNITY): Payer: Self-pay | Admitting: Obstetrics & Gynecology

## 2024-03-01 ENCOUNTER — Inpatient Hospital Stay (HOSPITAL_COMMUNITY)
Admission: AD | Admit: 2024-03-01 | Discharge: 2024-03-01 | Disposition: A | Discharge status: Home / Self Care | Attending: Obstetrics & Gynecology | Admitting: Obstetrics & Gynecology

## 2024-03-01 DIAGNOSIS — Z3A31 31 weeks gestation of pregnancy: Secondary | ICD-10-CM | POA: Insufficient documentation

## 2024-03-01 DIAGNOSIS — Z3493 Encounter for supervision of normal pregnancy, unspecified, third trimester: Secondary | ICD-10-CM

## 2024-03-01 DIAGNOSIS — O368130 Decreased fetal movements, third trimester, not applicable or unspecified: Secondary | ICD-10-CM

## 2024-03-01 DIAGNOSIS — Z0371 Encounter for suspected problem with amniotic cavity and membrane ruled out: Secondary | ICD-10-CM | POA: Insufficient documentation

## 2024-03-01 DIAGNOSIS — Z3689 Encounter for other specified antenatal screening: Secondary | ICD-10-CM

## 2024-03-01 LAB — WET PREP, GENITAL
Clue Cells Wet Prep HPF POC: NONE SEEN
Sperm: NONE SEEN
Trich, Wet Prep: NONE SEEN
WBC, Wet Prep HPF POC: <10
Yeast Wet Prep HPF POC: NONE SEEN

## 2024-03-01 LAB — URINALYSIS, ROUTINE W REFLEX MICROSCOPIC
Bilirubin Urine: NEGATIVE
Glucose, UA: NEGATIVE mg/dL
Hgb urine dipstick: NEGATIVE
Ketones, ur: NEGATIVE mg/dL
Leukocytes,Ua: NEGATIVE
Nitrite: NEGATIVE
Protein, ur: NEGATIVE mg/dL
Specific Gravity, Urine: 1.004 — ABNORMAL LOW (ref 1.005–1.030)
pH: 7 (ref 5.0–8.0)

## 2024-03-01 LAB — RUPTURE OF MEMBRANE (ROM)PLUS: Rom Plus: NEGATIVE

## 2024-03-01 LAB — POCT FERN TEST: POCT Fern Test: NEGATIVE

## 2024-03-01 NOTE — Progress Notes (Signed)
 "  PRENATAL VISIT NOTE  Subjective:  Molly Bryant is a 24 y.o. G1P0000 at [redacted]w[redacted]d being seen today for ongoing prenatal care.  She is currently monitored for the following issues for this high-risk pregnancy and has Migraine headache without aura; History of seizure; Encounter for supervision of normal first pregnancy; Subchorionic hematoma in second trimester (resolved); and Headache in pregnancy, antepartum, second trimester on their problem list.  Patient reports no complaints.  Contractions: Not present. Vag. Bleeding: None.  Movement: Present. Denies leaking of fluid.   The following portions of the patient's history were reviewed and updated as appropriate: allergies, current medications, past family history, past medical history, past social history, past surgical history and problem list.   Objective:   Vitals:   02/28/24 1600  BP: 134/78  Pulse: (!) 112  Weight: 175 lb (79.4 kg)    Fetal Status:  Fetal Heart Rate (bpm): 142 Fundal Height: 32 cm Movement: Present    General: Alert, oriented and cooperative. Patient is in no acute distress.  Skin: Skin is warm and dry. No rash noted.   Cardiovascular: Normal heart rate noted  Respiratory: Normal respiratory effort, no problems with respiration noted  Abdomen: Soft, gravid, appropriate for gestational age.  Pain/Pressure: Present     Pelvic: Cervical exam deferred        Extremities: Normal range of motion.     Mental Status: Normal mood and affect. Normal behavior. Normal judgment and thought content.      03/17/2023   10:40 AM 12/08/2022    3:21 PM 08/27/2022    3:41 PM  Depression screen PHQ 2/9  Decreased Interest 0 0 1  Down, Depressed, Hopeless 0 0 0  PHQ - 2 Score 0 0 1  Altered sleeping  0 1  Tired, decreased energy  0 1  Change in appetite  0 1  Feeling bad or failure about yourself   0 0  Trouble concentrating  0 1  Moving slowly or fidgety/restless  0 0  Suicidal thoughts  0 0  PHQ-9 Score  0  5    Difficult doing work/chores   Very difficult     Data saved with a previous flowsheet row definition        12/08/2022    3:21 PM 08/27/2022    3:41 PM  GAD 7 : Generalized Anxiety Score  Nervous, Anxious, on Edge 0  1   Control/stop worrying 0  1   Worry too much - different things 0  1   Trouble relaxing 0  1   Restless 0  1   Easily annoyed or irritable 0  1   Afraid - awful might happen 0  1   Total GAD 7 Score 0 7  Anxiety Difficulty Not difficult at all Very difficult     Data saved with a previous flowsheet row definition    Assessment and Plan:  Pregnancy: G1P0000 at [redacted]w[redacted]d 1. Anemia during pregnancy in third trimester (Primary) Pt amenable to PO iron.     Latest Ref Rng & Units 02/12/2024   10:31 AM 01/27/2024    8:33 AM 09/14/2023   10:17 AM  CBC  WBC 4.0 - 10.5 K/uL 9.2  8.4  9.8   Hemoglobin 12.0 - 15.0 g/dL 89.5  89.5  85.9   Hematocrit 36.0 - 46.0 % 30.1  32.1  42.9   Platelets 150 - 400 K/uL 188  197  243     2. Subchorionic hematoma in second trimester,  single or unspecified fetus Resolved. Repeat scan PRN 12/30: efw 67%, 1351g, ac 71%, afi 17, normal placenta  Preterm labor symptoms and general obstetric precautions including but not limited to vaginal bleeding, contractions, leaking of fluid and fetal movement were reviewed in detail with the patient. Please refer to After Visit Summary for other counseling recommendations.   No follow-ups on file.  Future Appointments  Date Time Provider Department Center  03/13/2024  3:50 PM Anyanwu, Gloris LABOR, MD [facility]  03/27/2024  3:50 PM Anyanwu, Gloris LABOR, MD [facility]  08/01/2024  2:45 PM Gregg Lek, MD [facility] None    Bebe Furry, MD  "

## 2024-03-01 NOTE — MAU Note (Addendum)
 Molly Bryant is a 24 y.o. at [redacted]w[redacted]d here in MAU reporting: LOF at 1340 clear, contractions and decreased FM, no c/o VB, HA, chest pain, or visual disturbances.   LMP:  Onset of complaint: 1340 Pain score: 5/10 Vitals:   03/01/24 1552 03/01/24 1555  BP: 121/68   Pulse: (!) 121   Resp: 16   SpO2: 99% 99%     FHT: in room  Lab orders placed from triage: none
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
## 2024-03-01 NOTE — Discharge Instructions (Signed)
 SABRA

## 2024-03-02 LAB — GC/CHLAMYDIA PROBE AMP (~~LOC~~) NOT AT ARMC
Chlamydia: NEGATIVE
Comment: NEGATIVE
Comment: NORMAL
Neisseria Gonorrhea: NEGATIVE

## 2024-03-06 ENCOUNTER — Telehealth: Payer: Self-pay | Admitting: Neurology

## 2024-03-06 NOTE — Telephone Encounter (Signed)
 MYC cxl

## 2024-03-09 ENCOUNTER — Other Ambulatory Visit (HOSPITAL_COMMUNITY): Payer: Self-pay

## 2024-03-10 ENCOUNTER — Encounter (HOSPITAL_COMMUNITY): Payer: Self-pay | Admitting: Family Medicine

## 2024-03-10 ENCOUNTER — Inpatient Hospital Stay (HOSPITAL_COMMUNITY)
Admission: AD | Admit: 2024-03-10 | Discharge: 2024-03-11 | Disposition: A | Attending: Family Medicine | Admitting: Family Medicine

## 2024-03-10 DIAGNOSIS — M549 Dorsalgia, unspecified: Secondary | ICD-10-CM | POA: Diagnosis not present

## 2024-03-10 DIAGNOSIS — O26893 Other specified pregnancy related conditions, third trimester: Secondary | ICD-10-CM | POA: Insufficient documentation

## 2024-03-10 DIAGNOSIS — O4703 False labor before 37 completed weeks of gestation, third trimester: Secondary | ICD-10-CM | POA: Diagnosis present

## 2024-03-10 DIAGNOSIS — Z3A33 33 weeks gestation of pregnancy: Secondary | ICD-10-CM | POA: Diagnosis not present

## 2024-03-10 DIAGNOSIS — O479 False labor, unspecified: Secondary | ICD-10-CM

## 2024-03-10 DIAGNOSIS — Z3689 Encounter for other specified antenatal screening: Secondary | ICD-10-CM | POA: Insufficient documentation

## 2024-03-11 DIAGNOSIS — O479 False labor, unspecified: Secondary | ICD-10-CM

## 2024-03-11 DIAGNOSIS — Z3A33 33 weeks gestation of pregnancy: Secondary | ICD-10-CM | POA: Diagnosis not present

## 2024-03-11 LAB — URINALYSIS, ROUTINE W REFLEX MICROSCOPIC
Bilirubin Urine: NEGATIVE
Glucose, UA: NEGATIVE mg/dL
Hgb urine dipstick: NEGATIVE
Ketones, ur: 5 mg/dL — AB
Leukocytes,Ua: NEGATIVE
Nitrite: NEGATIVE
Protein, ur: 30 mg/dL — AB
Specific Gravity, Urine: 1.026 (ref 1.005–1.030)
pH: 5 (ref 5.0–8.0)

## 2024-03-11 MED ORDER — NIFEDIPINE 10 MG PO CAPS
10.0000 mg | ORAL_CAPSULE | ORAL | Status: DC | PRN
  Administered 2024-03-11 (×4): 10 mg via ORAL
  Filled 2024-03-11 (×4): qty 1

## 2024-03-11 MED ORDER — LACTATED RINGERS IV BOLUS
1000.0000 mL | Freq: Once | INTRAVENOUS | Status: AC
  Administered 2024-03-11: 1000 mL via INTRAVENOUS

## 2024-03-11 MED ORDER — CYCLOBENZAPRINE HCL 10 MG PO TABS
10.0000 mg | ORAL_TABLET | Freq: Once | ORAL | Status: AC
  Administered 2024-03-11: 10 mg via ORAL
  Filled 2024-03-11: qty 1

## 2024-03-11 MED ORDER — OXYCODONE-ACETAMINOPHEN 5-325 MG PO TABS
2.0000 | ORAL_TABLET | Freq: Once | ORAL | Status: AC
  Administered 2024-03-11: 2 via ORAL
  Filled 2024-03-11: qty 2

## 2024-03-11 MED ORDER — MORPHINE SULFATE (PF) 4 MG/ML IV SOLN
4.0000 mg | Freq: Once | INTRAVENOUS | Status: AC
  Administered 2024-03-11: 4 mg via INTRAVENOUS
  Filled 2024-03-11: qty 1

## 2024-03-11 NOTE — MAU Note (Signed)
 Molly Bryant is a 24 y.o. at [redacted]w[redacted]d here in MAU reporting:   Pt states through out the day she was feeling braxton hicks but it wasn't until 0820pm she started feeling ctx pain.  Currently they are about apart.  No lof, no bleeding. +FM.   6/10 back pain  6/10 abdominal ctxs discomfort.  Vitals:   03/10/24 2355  BP: 129/77  Pulse: 98  Resp: 20  Temp: 97.8 F (36.6 C)  SpO2: 98%     FHT: 153 Lab orders placed from triage: UA
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
## 2024-03-13 ENCOUNTER — Encounter: Payer: Self-pay | Admitting: Obstetrics & Gynecology

## 2024-03-13 ENCOUNTER — Ambulatory Visit: Admitting: Obstetrics & Gynecology

## 2024-03-13 VITALS — BP 122/77 | HR 121

## 2024-03-13 DIAGNOSIS — Z3A33 33 weeks gestation of pregnancy: Secondary | ICD-10-CM | POA: Diagnosis not present

## 2024-03-13 DIAGNOSIS — O4703 False labor before 37 completed weeks of gestation, third trimester: Secondary | ICD-10-CM | POA: Diagnosis not present

## 2024-03-13 DIAGNOSIS — O26843 Uterine size-date discrepancy, third trimester: Secondary | ICD-10-CM

## 2024-03-13 DIAGNOSIS — Z3403 Encounter for supervision of normal first pregnancy, third trimester: Secondary | ICD-10-CM

## 2024-03-13 MED ORDER — CYCLOBENZAPRINE HCL 10 MG PO TABS: 10.0000 mg | ORAL_TABLET | Freq: Three times a day (TID) | ORAL | 0 refills | Status: AC | PRN

## 2024-03-13 MED ORDER — NIFEDIPINE ER OSMOTIC RELEASE 30 MG PO TB24: 30.0000 mg | ORAL_TABLET | Freq: Every day | ORAL | 2 refills | Status: AC

## 2024-03-13 NOTE — Patient Instructions (Signed)

## 2024-03-15 ENCOUNTER — Ambulatory Visit: Admitting: Obstetrics and Gynecology

## 2024-03-15 ENCOUNTER — Ambulatory Visit

## 2024-03-15 VITALS — BP 133/73 | HR 103

## 2024-03-15 DIAGNOSIS — O99353 Diseases of the nervous system complicating pregnancy, third trimester: Secondary | ICD-10-CM | POA: Diagnosis not present

## 2024-03-15 DIAGNOSIS — G40909 Epilepsy, unspecified, not intractable, without status epilepticus: Secondary | ICD-10-CM | POA: Diagnosis not present

## 2024-03-15 DIAGNOSIS — Z3A33 33 weeks gestation of pregnancy: Secondary | ICD-10-CM

## 2024-03-15 DIAGNOSIS — O4593 Premature separation of placenta, unspecified, third trimester: Secondary | ICD-10-CM

## 2024-03-15 DIAGNOSIS — O26843 Uterine size-date discrepancy, third trimester: Secondary | ICD-10-CM

## 2024-03-15 DIAGNOSIS — Z87898 Personal history of other specified conditions: Secondary | ICD-10-CM

## 2024-03-15 NOTE — Progress Notes (Signed)
 After review, MFM consult with provider is not indicated for today  Arna Ranks, MD 03/15/2024 12:41 PM  [facility]

## 2024-03-26 ENCOUNTER — Encounter: Admitting: Advanced Practice Midwife

## 2024-03-27 ENCOUNTER — Encounter: Admitting: Obstetrics & Gynecology

## 2024-08-01 ENCOUNTER — Ambulatory Visit: Admitting: Neurology
